# Patient Record
Sex: Female | Born: 1945 | Race: White | Hispanic: No | Smoking: Current every day smoker
Health system: Southern US, Community
[De-identification: ages and names within clinical notes are randomized; demographics above are authoritative.]

## PROBLEM LIST (undated history)

## (undated) DIAGNOSIS — K5732 Diverticulitis of large intestine without perforation or abscess without bleeding: Secondary | ICD-10-CM

## (undated) DIAGNOSIS — M1990 Unspecified osteoarthritis, unspecified site: Secondary | ICD-10-CM

## (undated) DIAGNOSIS — F32A Depression, unspecified: Secondary | ICD-10-CM

## (undated) DIAGNOSIS — I10 Essential (primary) hypertension: Secondary | ICD-10-CM

## (undated) DIAGNOSIS — F329 Major depressive disorder, single episode, unspecified: Secondary | ICD-10-CM

## (undated) DIAGNOSIS — K219 Gastro-esophageal reflux disease without esophagitis: Secondary | ICD-10-CM

## (undated) HISTORY — PX: GANGLION CYST EXCISION: SHX1691

## (undated) HISTORY — PX: APPENDECTOMY: SHX54

---

## 1973-06-03 HISTORY — PX: TONSILLECTOMY: SUR1361

## 1977-06-03 HISTORY — PX: ABDOMINAL HYSTERECTOMY: SHX81

## 1998-06-03 HISTORY — PX: AUGMENTATION MAMMAPLASTY: SUR837

## 1999-06-04 HISTORY — PX: BUNIONECTOMY: SHX129

## 2000-05-13 ENCOUNTER — Encounter: Admission: RE | Admit: 2000-05-13 | Discharge: 2000-05-13 | Payer: Self-pay

## 2005-01-31 ENCOUNTER — Ambulatory Visit: Payer: Self-pay | Admitting: Physician Assistant

## 2005-03-19 ENCOUNTER — Encounter: Payer: Self-pay | Admitting: Physical Medicine and Rehabilitation

## 2005-04-03 ENCOUNTER — Encounter: Payer: Self-pay | Admitting: Physical Medicine and Rehabilitation

## 2005-05-17 ENCOUNTER — Emergency Department: Payer: Self-pay | Admitting: Emergency Medicine

## 2006-03-05 ENCOUNTER — Encounter: Payer: Self-pay | Admitting: Orthopedic Surgery

## 2006-04-03 ENCOUNTER — Ambulatory Visit: Payer: Self-pay | Admitting: Physician Assistant

## 2006-04-03 ENCOUNTER — Encounter: Payer: Self-pay | Admitting: Orthopedic Surgery

## 2006-05-03 ENCOUNTER — Encounter: Payer: Self-pay | Admitting: Orthopedic Surgery

## 2006-06-03 ENCOUNTER — Encounter: Payer: Self-pay | Admitting: Orthopedic Surgery

## 2006-07-04 ENCOUNTER — Encounter: Payer: Self-pay | Admitting: Orthopedic Surgery

## 2006-07-11 ENCOUNTER — Encounter: Admission: RE | Admit: 2006-07-11 | Discharge: 2006-07-11 | Payer: Self-pay | Admitting: Orthopedic Surgery

## 2006-09-04 ENCOUNTER — Ambulatory Visit: Payer: Self-pay

## 2007-06-26 ENCOUNTER — Ambulatory Visit: Payer: Self-pay | Admitting: Physician Assistant

## 2008-01-28 ENCOUNTER — Ambulatory Visit: Payer: Self-pay | Admitting: Unknown Physician Specialty

## 2008-12-13 ENCOUNTER — Ambulatory Visit: Payer: Self-pay | Admitting: Physician Assistant

## 2009-12-26 ENCOUNTER — Ambulatory Visit: Payer: Self-pay | Admitting: Physician Assistant

## 2010-06-03 HISTORY — PX: SHOULDER ARTHROSCOPY: SHX128

## 2011-01-18 ENCOUNTER — Ambulatory Visit: Payer: Self-pay | Admitting: Unknown Physician Specialty

## 2011-02-13 ENCOUNTER — Ambulatory Visit: Payer: Self-pay | Admitting: Unknown Physician Specialty

## 2011-12-31 ENCOUNTER — Ambulatory Visit: Payer: Self-pay | Admitting: Physician Assistant

## 2012-02-21 ENCOUNTER — Encounter (HOSPITAL_COMMUNITY): Payer: Self-pay | Admitting: Pharmacy Technician

## 2012-02-26 ENCOUNTER — Encounter (HOSPITAL_COMMUNITY): Payer: Self-pay

## 2012-02-26 ENCOUNTER — Encounter (HOSPITAL_COMMUNITY)
Admission: RE | Admit: 2012-02-26 | Discharge: 2012-02-26 | Disposition: A | Discharge status: Home / Self Care | Payer: Medicare Other | Source: Ambulatory Visit | Attending: Orthopedic Surgery | Admitting: Orthopedic Surgery

## 2012-02-26 ENCOUNTER — Ambulatory Visit (HOSPITAL_COMMUNITY)
Admission: RE | Admit: 2012-02-26 | Discharge: 2012-02-26 | Disposition: A | Discharge status: Home / Self Care | Payer: Medicare Other | Source: Ambulatory Visit | Attending: Orthopedic Surgery | Admitting: Orthopedic Surgery

## 2012-02-26 DIAGNOSIS — Z01818 Encounter for other preprocedural examination: Secondary | ICD-10-CM | POA: Insufficient documentation

## 2012-02-26 DIAGNOSIS — Z01812 Encounter for preprocedural laboratory examination: Secondary | ICD-10-CM | POA: Insufficient documentation

## 2012-02-26 DIAGNOSIS — R911 Solitary pulmonary nodule: Secondary | ICD-10-CM | POA: Insufficient documentation

## 2012-02-26 HISTORY — DX: Depression, unspecified: F32.A

## 2012-02-26 HISTORY — DX: Essential (primary) hypertension: I10

## 2012-02-26 HISTORY — DX: Gastro-esophageal reflux disease without esophagitis: K21.9

## 2012-02-26 HISTORY — DX: Unspecified osteoarthritis, unspecified site: M19.90

## 2012-02-26 HISTORY — DX: Major depressive disorder, single episode, unspecified: F32.9

## 2012-02-26 LAB — CBC
MCV: 90.5 fL (ref 78.0–100.0)
Platelets: 259 10*3/uL (ref 150–400)
RDW: 12.7 % (ref 11.5–15.5)
WBC: 10.9 10*3/uL — ABNORMAL HIGH (ref 4.0–10.5)

## 2012-02-26 LAB — BASIC METABOLIC PANEL
CO2: 28 mEq/L (ref 19–32)
Calcium: 10.1 mg/dL (ref 8.4–10.5)
Creatinine, Ser: 0.81 mg/dL (ref 0.50–1.10)
GFR calc Af Amer: 85 mL/min — ABNORMAL LOW (ref >90)
GFR calc non Af Amer: 73 mL/min — ABNORMAL LOW (ref >90)

## 2012-02-26 LAB — SURGICAL PCR SCREEN
MRSA, PCR: NEGATIVE
Staphylococcus aureus: NEGATIVE

## 2012-02-26 MED ORDER — CHLORHEXIDINE GLUCONATE 4 % EX LIQD: 60.0000 mL | Freq: Once | CUTANEOUS | Status: DC

## 2012-02-26 NOTE — Pre-Procedure Instructions (Addendum)
20 Audrey Acosta  02/26/2012   Your procedure is scheduled on:  03/05/12  Report to [facility] at530 AM.  Call this number if you have problems the morning of surgery: [phone]   Remember:   Do not eat food or drink:After Midnight.    Take these medicines the morning of surgery with A SIP OF WATER: pain med , celexa, bupropion, metoprolol, omeprazole   Do not wear jewelry, make-up or nail polish.  Do not wear lotions, powders, or perfumes. .  Do not shave 48 hours prior to surgery. Men may shave face and neck.  Do not bring valuables to the hospital.  Contacts, dentures or bridgework may not be worn into surgery.  Leave suitcase in the car. After surgery it may be brought to your room.  For patients admitted to the hospital, checkout time is 11:00 AM the day of discharge.   Patients discharged the day of surgery will not be allowed to drive home.  Name and phone number of your driverdtr Audrey Acosta [phone]  Special Instructions: Incentive Spirometry - Practice and bring it with you on the day of surgery. Shower using CHG 2 nights before surgery and the night before surgery.  If you shower the day of surgery use CHG.  Use special wash - you have one bottle of CHG for all showers.  You should use approximately 1/3 of the bottle for each shower.   Please read over the following fact sheets that you were given: Pain Booklet, Coughing and Deep Breathing, Blood Transfusion Information, MRSA Information and Surgical Site Infection Prevention

## 2012-03-04 MED ORDER — CEFAZOLIN SODIUM-DEXTROSE 2-3 GM-% IV SOLR
2.0000 g | INTRAVENOUS | Status: AC
  Administered 2012-03-05: 2 g via INTRAVENOUS
  Filled 2012-03-04: qty 50

## 2012-03-04 MED ORDER — LACTATED RINGERS IV SOLN
INTRAVENOUS | Status: DC
  Administered 2012-03-05 (×2): via INTRAVENOUS

## 2012-03-05 ENCOUNTER — Encounter (HOSPITAL_COMMUNITY)
Admission: RE | Disposition: A | Discharge status: Home / Self Care | Payer: Self-pay | Source: Ambulatory Visit | Attending: Orthopedic Surgery

## 2012-03-05 ENCOUNTER — Ambulatory Visit (HOSPITAL_COMMUNITY): Payer: Medicare Other | Admitting: Anesthesiology

## 2012-03-05 ENCOUNTER — Inpatient Hospital Stay (HOSPITAL_COMMUNITY)
Admission: RE | Admit: 2012-03-05 | Discharge: 2012-03-06 | DRG: 484 | Disposition: A | Discharge status: Home / Self Care | Payer: Medicare Other | Source: Ambulatory Visit | Attending: Orthopedic Surgery | Admitting: Orthopedic Surgery

## 2012-03-05 ENCOUNTER — Encounter (HOSPITAL_COMMUNITY): Payer: Self-pay

## 2012-03-05 ENCOUNTER — Encounter (HOSPITAL_COMMUNITY): Payer: Self-pay | Admitting: Anesthesiology

## 2012-03-05 DIAGNOSIS — F17200 Nicotine dependence, unspecified, uncomplicated: Secondary | ICD-10-CM | POA: Diagnosis present

## 2012-03-05 DIAGNOSIS — F329 Major depressive disorder, single episode, unspecified: Secondary | ICD-10-CM | POA: Diagnosis present

## 2012-03-05 DIAGNOSIS — K219 Gastro-esophageal reflux disease without esophagitis: Secondary | ICD-10-CM | POA: Diagnosis present

## 2012-03-05 DIAGNOSIS — Z79899 Other long term (current) drug therapy: Secondary | ICD-10-CM

## 2012-03-05 DIAGNOSIS — I10 Essential (primary) hypertension: Secondary | ICD-10-CM | POA: Diagnosis present

## 2012-03-05 DIAGNOSIS — M19019 Primary osteoarthritis, unspecified shoulder: Principal | ICD-10-CM | POA: Diagnosis present

## 2012-03-05 DIAGNOSIS — F3289 Other specified depressive episodes: Secondary | ICD-10-CM | POA: Diagnosis present

## 2012-03-05 HISTORY — PX: REVERSE SHOULDER ARTHROPLASTY: SHX5054

## 2012-03-05 SURGERY — ARTHROPLASTY, SHOULDER, TOTAL, REVERSE
Anesthesia: General | Site: Shoulder | Laterality: Right | Wound class: Clean

## 2012-03-05 MED ORDER — FENTANYL CITRATE 0.05 MG/ML IJ SOLN
INTRAMUSCULAR | Status: DC | PRN
  Administered 2012-03-05: 250 ug via INTRAVENOUS

## 2012-03-05 MED ORDER — ROCURONIUM BROMIDE 100 MG/10ML IV SOLN
INTRAVENOUS | Status: DC | PRN
  Administered 2012-03-05: 40 mg via INTRAVENOUS

## 2012-03-05 MED ORDER — SIMVASTATIN 20 MG PO TABS
20.0000 mg | ORAL_TABLET | Freq: Every day | ORAL | Status: DC
  Administered 2012-03-05: 20 mg via ORAL
  Filled 2012-03-05 (×2): qty 1

## 2012-03-05 MED ORDER — PHENYLEPHRINE HCL 10 MG/ML IJ SOLN
INTRAMUSCULAR | Status: DC | PRN
  Administered 2012-03-05: 120 ug via INTRAVENOUS
  Administered 2012-03-05 (×5): 80 ug via INTRAVENOUS

## 2012-03-05 MED ORDER — PROPOFOL 10 MG/ML IV BOLUS
INTRAVENOUS | Status: DC | PRN
  Administered 2012-03-05: 140 mg via INTRAVENOUS

## 2012-03-05 MED ORDER — NEOSTIGMINE METHYLSULFATE 1 MG/ML IJ SOLN
INTRAMUSCULAR | Status: DC | PRN
  Administered 2012-03-05: 2 mg via INTRAVENOUS

## 2012-03-05 MED ORDER — ALUM & MAG HYDROXIDE-SIMETH 200-200-20 MG/5ML PO SUSP: 30.0000 mL | ORAL | Status: DC | PRN

## 2012-03-05 MED ORDER — TRAZODONE HCL 50 MG PO TABS
50.0000 mg | ORAL_TABLET | Freq: Every day | ORAL | Status: DC
  Administered 2012-03-05: 50 mg via ORAL
  Filled 2012-03-05 (×2): qty 1

## 2012-03-05 MED ORDER — HYDROMORPHONE HCL PF 1 MG/ML IJ SOLN: 0.2500 mg | INTRAMUSCULAR | Status: DC | PRN

## 2012-03-05 MED ORDER — ACETAMINOPHEN 325 MG PO TABS: 650.0000 mg | ORAL_TABLET | Freq: Four times a day (QID) | ORAL | Status: DC | PRN

## 2012-03-05 MED ORDER — ONDANSETRON HCL 4 MG/2ML IJ SOLN: 4.0000 mg | Freq: Four times a day (QID) | INTRAMUSCULAR | Status: DC | PRN

## 2012-03-05 MED ORDER — METOCLOPRAMIDE HCL 10 MG PO TABS: 5.0000 mg | ORAL_TABLET | Freq: Three times a day (TID) | ORAL | Status: DC | PRN

## 2012-03-05 MED ORDER — ACETAMINOPHEN 10 MG/ML IV SOLN: 1000.0000 mg | Freq: Once | INTRAVENOUS | Status: DC | PRN

## 2012-03-05 MED ORDER — DEXAMETHASONE SODIUM PHOSPHATE 4 MG/ML IJ SOLN
INTRAMUSCULAR | Status: DC | PRN
  Administered 2012-03-05: 4 mg via INTRAVENOUS

## 2012-03-05 MED ORDER — SODIUM CHLORIDE 0.9 % IR SOLN
Status: DC | PRN
  Administered 2012-03-05: 1000 mL

## 2012-03-05 MED ORDER — GLYCOPYRROLATE 0.2 MG/ML IJ SOLN
INTRAMUSCULAR | Status: DC | PRN
  Administered 2012-03-05: .4 mg via INTRAVENOUS

## 2012-03-05 MED ORDER — KETOROLAC TROMETHAMINE 15 MG/ML IJ SOLN
15.0000 mg | Freq: Four times a day (QID) | INTRAMUSCULAR | Status: DC
  Administered 2012-03-05 – 2012-03-06 (×4): 15 mg via INTRAVENOUS
  Filled 2012-03-05 (×8): qty 1

## 2012-03-05 MED ORDER — CITALOPRAM HYDROBROMIDE 20 MG PO TABS
20.0000 mg | ORAL_TABLET | Freq: Every day | ORAL | Status: DC
  Administered 2012-03-06: 20 mg via ORAL
  Filled 2012-03-05: qty 1

## 2012-03-05 MED ORDER — METOPROLOL TARTRATE 50 MG PO TABS
75.0000 mg | ORAL_TABLET | Freq: Two times a day (BID) | ORAL | Status: DC
  Administered 2012-03-05: 75 mg via ORAL
  Filled 2012-03-05 (×3): qty 1

## 2012-03-05 MED ORDER — HYDROCODONE-ACETAMINOPHEN 5-325 MG PO TABS: 0.5000 | ORAL_TABLET | Freq: Two times a day (BID) | ORAL | Status: DC | PRN

## 2012-03-05 MED ORDER — ACETAMINOPHEN 650 MG RE SUPP: 650.0000 mg | Freq: Four times a day (QID) | RECTAL | Status: DC | PRN

## 2012-03-05 MED ORDER — CEFAZOLIN SODIUM 1-5 GM-% IV SOLN
1.0000 g | Freq: Four times a day (QID) | INTRAVENOUS | Status: AC
  Administered 2012-03-05 – 2012-03-06 (×3): 1 g via INTRAVENOUS
  Filled 2012-03-05 (×4): qty 50

## 2012-03-05 MED ORDER — LISINOPRIL 20 MG PO TABS
20.0000 mg | ORAL_TABLET | Freq: Two times a day (BID) | ORAL | Status: DC
Start: 2012-03-05 — End: 2012-03-06
  Administered 2012-03-05 (×2): 20 mg via ORAL
  Filled 2012-03-05 (×4): qty 1

## 2012-03-05 MED ORDER — BUPROPION HCL ER (SMOKING DET) 150 MG PO TB12
150.0000 mg | ORAL_TABLET | Freq: Two times a day (BID) | ORAL | Status: DC
  Administered 2012-03-06: 150 mg via ORAL
  Filled 2012-03-05 (×3): qty 1

## 2012-03-05 MED ORDER — TEMAZEPAM 15 MG PO CAPS: 15.0000 mg | ORAL_CAPSULE | Freq: Every evening | ORAL | Status: DC | PRN

## 2012-03-05 MED ORDER — ONDANSETRON HCL 4 MG PO TABS: 4.0000 mg | ORAL_TABLET | Freq: Four times a day (QID) | ORAL | Status: DC | PRN

## 2012-03-05 MED ORDER — POLYETHYLENE GLYCOL 3350 17 G PO PACK: 17.0000 g | PACK | Freq: Every day | ORAL | Status: DC | PRN

## 2012-03-05 MED ORDER — EPHEDRINE SULFATE 50 MG/ML IJ SOLN
INTRAMUSCULAR | Status: DC | PRN
  Administered 2012-03-05 (×2): 10 mg via INTRAVENOUS
  Administered 2012-03-05 (×2): 15 mg via INTRAVENOUS

## 2012-03-05 MED ORDER — DOCUSATE SODIUM 100 MG PO CAPS
100.0000 mg | ORAL_CAPSULE | Freq: Two times a day (BID) | ORAL | Status: DC
  Administered 2012-03-05 – 2012-03-06 (×3): 100 mg via ORAL
  Filled 2012-03-05 (×3): qty 1

## 2012-03-05 MED ORDER — PANTOPRAZOLE SODIUM 40 MG PO TBEC
40.0000 mg | DELAYED_RELEASE_TABLET | Freq: Every day | ORAL | Status: DC
  Filled 2012-03-05: qty 1

## 2012-03-05 MED ORDER — MIDAZOLAM HCL 5 MG/5ML IJ SOLN
INTRAMUSCULAR | Status: DC | PRN
  Administered 2012-03-05: 2 mg via INTRAVENOUS

## 2012-03-05 MED ORDER — LIDOCAINE HCL (CARDIAC) 20 MG/ML IV SOLN
INTRAVENOUS | Status: DC | PRN
  Administered 2012-03-05: 40 mg via INTRAVENOUS

## 2012-03-05 MED ORDER — HYDROCODONE-ACETAMINOPHEN 5-325 MG PO TABS
1.0000 | ORAL_TABLET | ORAL | Status: DC | PRN
  Administered 2012-03-05: 1 via ORAL
  Administered 2012-03-06: 2 via ORAL
  Administered 2012-03-06: 1 via ORAL
  Filled 2012-03-05: qty 1
  Filled 2012-03-05: qty 2
  Filled 2012-03-05: qty 1

## 2012-03-05 MED ORDER — DIPHENHYDRAMINE HCL 12.5 MG/5ML PO ELIX: 12.5000 mg | ORAL_SOLUTION | ORAL | Status: DC | PRN

## 2012-03-05 MED ORDER — LIDOCAINE HCL 4 % MT SOLN
OROMUCOSAL | Status: DC | PRN
  Administered 2012-03-05: 4 mL via TOPICAL

## 2012-03-05 MED ORDER — HYDROMORPHONE HCL PF 1 MG/ML IJ SOLN
0.5000 mg | INTRAMUSCULAR | Status: DC | PRN
  Administered 2012-03-06: 1 mg via INTRAVENOUS
  Filled 2012-03-05: qty 1

## 2012-03-05 MED ORDER — PHENOL 1.4 % MT LIQD: 1.0000 | OROMUCOSAL | Status: DC | PRN

## 2012-03-05 MED ORDER — MENTHOL 3 MG MT LOZG
1.0000 | LOZENGE | OROMUCOSAL | Status: DC | PRN
  Administered 2012-03-05: 3 mg via ORAL
  Filled 2012-03-05: qty 9

## 2012-03-05 MED ORDER — ONDANSETRON HCL 4 MG/2ML IJ SOLN
INTRAMUSCULAR | Status: DC | PRN
  Administered 2012-03-05: 4 mg via INTRAVENOUS

## 2012-03-05 MED ORDER — METOCLOPRAMIDE HCL 5 MG/ML IJ SOLN: 5.0000 mg | Freq: Three times a day (TID) | INTRAMUSCULAR | Status: DC | PRN

## 2012-03-05 MED ORDER — ONDANSETRON HCL 4 MG/2ML IJ SOLN: 4.0000 mg | Freq: Once | INTRAMUSCULAR | Status: DC | PRN

## 2012-03-05 SURGICAL SUPPLY — 70 items
BLADE SAW SGTL 83.5X18.5 (BLADE) ×2 IMPLANT
BRUSH FEMORAL CANAL (MISCELLANEOUS) IMPLANT
CEMENT BONE DEPUY (Cement) ×4 IMPLANT
CLOTH BEACON ORANGE TIMEOUT ST (SAFETY) ×2 IMPLANT
CLSR STERI-STRIP ANTIMIC 1/2X4 (GAUZE/BANDAGES/DRESSINGS) ×1 IMPLANT
COVER SURGICAL LIGHT HANDLE (MISCELLANEOUS) ×2 IMPLANT
DRAPE INCISE IOBAN 66X45 STRL (DRAPES) ×2 IMPLANT
DRAPE SURG 17X11 SM STRL (DRAPES) ×2 IMPLANT
DRAPE U-SHAPE 47X51 STRL (DRAPES) ×2 IMPLANT
DRILL BIT 7/64X5 (BIT) ×1 IMPLANT
DRSG AQUACEL AG ADV 3.5X10 (GAUZE/BANDAGES/DRESSINGS) ×1 IMPLANT
DRSG MEPILEX BORDER 4X8 (GAUZE/BANDAGES/DRESSINGS) IMPLANT
DURAPREP 26ML APPLICATOR (WOUND CARE) ×2 IMPLANT
ELECT BLADE 4.0 EZ CLEAN MEGAD (MISCELLANEOUS)
ELECT CAUTERY BLADE 6.4 (BLADE) ×2 IMPLANT
ELECT REM PT RETURN 9FT ADLT (ELECTROSURGICAL) ×2
ELECTRODE BLDE 4.0 EZ CLN MEGD (MISCELLANEOUS) IMPLANT
ELECTRODE REM PT RTRN 9FT ADLT (ELECTROSURGICAL) ×1 IMPLANT
FACESHIELD LNG OPTICON STERILE (SAFETY) ×6 IMPLANT
GLOVE BIO SURGEON STRL SZ7.5 (GLOVE) ×2 IMPLANT
GLOVE BIO SURGEON STRL SZ8 (GLOVE) ×2 IMPLANT
GLOVE BIO SURGEON STRL SZ8.5 (GLOVE) ×2 IMPLANT
GLOVE BIOGEL PI IND STRL 8 (GLOVE) ×1 IMPLANT
GLOVE BIOGEL PI INDICATOR 8 (GLOVE) ×1
GLOVE ECLIPSE 8.5 STRL (GLOVE) ×1 IMPLANT
GLOVE EUDERMIC 7 POWDERFREE (GLOVE) ×2 IMPLANT
GLOVE SS BIOGEL STRL SZ 7.5 (GLOVE) ×1 IMPLANT
GLOVE SUPERSENSE BIOGEL SZ 7.5 (GLOVE) ×1
GLOVE SURG SS PI 8.0 STRL IVOR (GLOVE) ×1 IMPLANT
GOWN PREVENTION PLUS XXLARGE (GOWN DISPOSABLE) ×2 IMPLANT
GOWN STRL NON-REIN LRG LVL3 (GOWN DISPOSABLE) ×1 IMPLANT
GOWN STRL REIN XL XLG (GOWN DISPOSABLE) ×6 IMPLANT
HANDPIECE INTERPULSE COAX TIP (DISPOSABLE)
KIT BASIN OR (CUSTOM PROCEDURE TRAY) ×2 IMPLANT
KIT ROOM TURNOVER OR (KITS) ×2 IMPLANT
MANIFOLD NEPTUNE II (INSTRUMENTS) ×2 IMPLANT
NDL HYPO 25GX1X1/2 BEV (NEEDLE) IMPLANT
NDL SUT 6 .5 CRC .975X.05 MAYO (NEEDLE) IMPLANT
NEEDLE HYPO 25GX1X1/2 BEV (NEEDLE) IMPLANT
NEEDLE MAYO TAPER (NEEDLE) ×2
NS IRRIG 1000ML POUR BTL (IV SOLUTION) ×2 IMPLANT
PACK SHOULDER (CUSTOM PROCEDURE TRAY) ×2 IMPLANT
PAD ARMBOARD 7.5X6 YLW CONV (MISCELLANEOUS) ×4 IMPLANT
PASSER SUT SWANSON 36MM LOOP (INSTRUMENTS) IMPLANT
PRESSURIZER FEMORAL UNIV (MISCELLANEOUS) IMPLANT
RESTRICTOR CEMENT PE SZ 2 (Cement) ×2 IMPLANT
SET HNDPC FAN SPRY TIP SCT (DISPOSABLE) IMPLANT
SLING ARM IMMOBILIZER LRG (SOFTGOODS) IMPLANT
SLING ARM IMMOBILIZER MED (SOFTGOODS) ×2 IMPLANT
SPONGE LAP 18X18 X RAY DECT (DISPOSABLE) ×2 IMPLANT
SPONGE LAP 4X18 X RAY DECT (DISPOSABLE) ×2 IMPLANT
STRIP CLOSURE SKIN 1/2X4 (GAUZE/BANDAGES/DRESSINGS) ×1 IMPLANT
SUCTION FRAZIER TIP 10 FR DISP (SUCTIONS) ×2 IMPLANT
SUT BONE WAX W31G (SUTURE) IMPLANT
SUT FIBERWIRE #2 38 T-5 BLUE (SUTURE) ×6
SUT MNCRL AB 3-0 PS2 18 (SUTURE) ×2 IMPLANT
SUT VIC AB 1 CT1 27 (SUTURE) ×2
SUT VIC AB 1 CT1 27XBRD ANBCTR (SUTURE) ×3 IMPLANT
SUT VIC AB 2-0 CT1 27 (SUTURE) ×2
SUT VIC AB 2-0 CT1 TAPERPNT 27 (SUTURE) ×1 IMPLANT
SUT VIC AB 2-0 SH 27 (SUTURE)
SUT VIC AB 2-0 SH 27X BRD (SUTURE) IMPLANT
SUTURE FIBERWR #2 38 T-5 BLUE (SUTURE) ×3 IMPLANT
SYR 30ML SLIP (SYRINGE) IMPLANT
SYR CONTROL 10ML LL (SYRINGE) IMPLANT
TOWEL OR 17X24 6PK STRL BLUE (TOWEL DISPOSABLE) ×2 IMPLANT
TOWEL OR 17X26 10 PK STRL BLUE (TOWEL DISPOSABLE) ×2 IMPLANT
TOWER CARTRIDGE SMART MIX (DISPOSABLE) ×1 IMPLANT
TRAY FOLEY CATH 14FR (SET/KITS/TRAYS/PACK) IMPLANT
WATER STERILE IRR 1000ML POUR (IV SOLUTION) ×2 IMPLANT

## 2012-03-05 NOTE — Anesthesia Preprocedure Evaluation (Signed)
Anesthesia Evaluation  Patient identified by MRN, date of birth, ID band Patient awake    Reviewed: Allergy & Precautions, H&P , NPO status , Patient's Chart, lab work & pertinent test results  Airway Mallampati: II      Dental  (+) Teeth Intact   Pulmonary  breath sounds clear to auscultation        Cardiovascular Rhythm:Regular Rate:Normal     Neuro/Psych    GI/Hepatic   Endo/Other    Renal/GU      Musculoskeletal   Abdominal   Peds  Hematology   Anesthesia Other Findings   Reproductive/Obstetrics                           Anesthesia Physical Anesthesia Plan  ASA: II  Anesthesia Plan: General   Post-op Pain Management:    Induction: Intravenous  Airway Management Planned: Oral ETT  Additional Equipment:   Intra-op Plan:   Post-operative Plan: Extubation in OR  Informed Consent: I have reviewed the patients History and Physical, chart, labs and discussed the procedure including the risks, benefits and alternatives for the proposed anesthesia with the patient or authorized representative who has indicated his/her understanding and acceptance.   Dental advisory given  Plan Discussed with: CRNA and Surgeon  Anesthesia Plan Comments: (DJD R. Shoulder Htn GERD  Plan GA with interscalene  Kipp Brood, MD)        Anesthesia Quick Evaluation

## 2012-03-05 NOTE — Progress Notes (Signed)
UR COMPLETED  

## 2012-03-05 NOTE — Preoperative (Signed)
Beta Blockers   Reason not to administer Beta Blockers:Not Applicable 

## 2012-03-05 NOTE — Transfer of Care (Signed)
Immediate Anesthesia Transfer of Care Note  Patient: Audrey Acosta  Procedure(s) Performed: Procedure(s) (LRB) with comments: REVERSE SHOULDER ARTHROPLASTY (Right) - right reverse shoulder arthroplasty  Patient Location: PACU  Anesthesia Type: General  Level of Consciousness: awake, alert  and oriented  Airway & Oxygen Therapy: Patient Spontanous Breathing and Patient connected to nasal cannula oxygen  Post-op Assessment: Report given to PACU RN, Post -op Vital signs reviewed and stable and Patient moving all extremities X 4  Post vital signs: Reviewed and stable  Complications: No apparent anesthesia complications

## 2012-03-05 NOTE — Anesthesia Postprocedure Evaluation (Signed)
  Anesthesia Post-op Note  Patient: Audrey Acosta  Procedure(s) Performed: Procedure(s) (LRB) with comments: REVERSE SHOULDER ARTHROPLASTY (Right) - right reverse shoulder arthroplasty  Patient Location: PACU  Anesthesia Type: GA combined with regional for post-op pain  Level of Consciousness: awake, alert  and oriented  Airway and Oxygen Therapy: Patient Spontanous Breathing and Patient connected to nasal cannula oxygen  Post-op Pain: none  Post-op Assessment: Post-op Vital signs reviewed, Patient's Cardiovascular Status Stable, Respiratory Function Stable, Patent Airway, No signs of Nausea or vomiting and Adequate PO intake  Post-op Vital Signs: Reviewed and stable  Complications: No apparent anesthesia complications

## 2012-03-05 NOTE — Anesthesia Procedure Notes (Addendum)
Anesthesia Regional Block:  Interscalene brachial plexus block  Pre-Anesthetic Checklist: ,, timeout performed, Correct Patient, Correct Site, Correct Laterality, Correct Procedure, Correct Position, site marked, Risks and benefits discussed,  Surgical consent,  Pre-op evaluation,  At surgeon's request and post-op pain management  Laterality: Right  Prep: chloraprep       Needles:   Needle Type: Echogenic Stimulator Needle      Needle Gauge: 22 and 22 G    Additional Needles:  Procedures: ultrasound guided and nerve stimulator Interscalene brachial plexus block Narrative:  Start time: 03/05/2012 7:20 AM End time: 03/05/2012 7:30 AM  Performed by: Personally   Additional Notes: 25 cc 0.5% marcaine with 1:200 Epi injected easily      Procedure Name: Intubation Date/Time: 03/05/2012 7:53 AM Performed by: Marena Chancy Pre-anesthesia Checklist: Emergency Drugs available, Patient identified, Timeout performed, Suction available and Patient being monitored Patient Re-evaluated:Patient Re-evaluated prior to inductionOxygen Delivery Method: Circle system utilized Preoxygenation: Pre-oxygenation with 100% oxygen Intubation Type: IV induction Ventilation: Mask ventilation without difficulty Grade View: Grade I Tube type: Oral Tube size: 7.0 mm Number of attempts: 1 Placement Confirmation: ETT inserted through vocal cords under direct vision,  breath sounds checked- equal and bilateral and positive ETCO2 Secured at: 21 cm Tube secured with: Tape Dental Injury: Teeth and Oropharynx as per pre-operative assessment

## 2012-03-05 NOTE — Op Note (Signed)
03/05/2012  9:55 AM  PATIENT:   Audrey Acosta  66 y.o. female  PRE-OPERATIVE DIAGNOSIS:  right shoulder rct arthropathy   POST-OPERATIVE DIAGNOSIS:  same  PROCEDURE:  Right reverse shoulder arthroplasaty  SURGEON:  Rennis Chris Vania Rea. M.D.  ASSISTANTS: Shuford pac   ANESTHESIA:   GET + ISB  EBL: 250  SPECIMEN:  none  Drains: none   PATIENT DISPOSITION:  PACU - hemodynamically stable.    PLAN OF CARE: Admit to inpatient   Dictation# [id number]

## 2012-03-05 NOTE — Progress Notes (Signed)
Pt. Reports that she had Stress test, several yrs ago, states she "aced" it. Pt. Denies history of heart problems. EKG done this a.m. - on chart

## 2012-03-05 NOTE — H&P (Signed)
Audrey Acosta    Chief Complaint: right shoulder rct arthropathy  HPI: The patient is a 66 y.o. 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 Hypertension   . Depression   . GERD (gastroesophageal reflux disease)   . Arthritis     Past Surgical History  Procedure Date  . Shoulder arthroscopy 12    rt, lft rotator 07,08  . Bunionectomy 01    rt  . Ganglion cyst excision 90's    rt wrist  . Tonsillectomy 75  . Appendectomy   . Abdominal hysterectomy 79    No family history on file.  Social History:  reports that she has been smoking Cigarettes.  She has been smoking about .25 packs per day for the past 0 years. She does not have any smokeless tobacco history on file. She reports that she does not drink alcohol or use illicit drugs.  Allergies:  Allergies  Allergen Reactions  . Macrodantin (Nitrofurantoin Macrocrystal) Shortness Of Breath  . Percocet (Oxycodone-Acetaminophen)     "makes me looney"    Medications Prior to Admission  Medication Sig Dispense Refill  . buPROPion (ZYBAN) 150 MG 12 hr tablet Take 150 mg by mouth 2 (two) times daily.      . citalopram (CELEXA) 20 MG tablet Take 20 mg by mouth daily.      . hydrochlorothiazide (HYDRODIURIL) 25 MG tablet Take 25 mg by mouth daily as needed.      Marland Kitchen HYDROcodone-acetaminophen (NORCO/VICODIN) 5-325 MG per tablet Take 0.5-1 tablets by mouth 2 (two) times daily.      Marland Kitchen ibuprofen (ADVIL,MOTRIN) 200 MG tablet Take 200 mg by mouth every 6 (six) hours as needed.      Marland Kitchen lisinopril (PRINIVIL,ZESTRIL) 20 MG tablet Take 20 mg by mouth 2 (two) times daily.      . metoprolol (LOPRESSOR) 50 MG tablet Take 75 mg by mouth 2 (two) times daily.      Marland Kitchen omeprazole (PRILOSEC) 20 MG capsule Take 20 mg by mouth daily.      . pravastatin (PRAVACHOL) 40 MG tablet Take 40 mg by mouth daily.      . traZODone (DESYREL) 50 MG tablet Take 50 mg by mouth at bedtime.         Physical  Exam: right shoulder with painful and restricted ROM as noted at 02/19/12 office visit.  Vitals  Temp:  [98.2 F (36.8 C)] 98.2 F (36.8 C) (10/03 0610) Pulse Rate:  [61] 61  (10/03 0610) Resp:  [18] 18  (10/03 0610) BP: (116)/(72) 116/72 mmHg (10/03 0610) SpO2:  [96 %] 96 % (10/03 0610)  Assessment/Plan  Impression: right shoulder rct arthropathy   Plan of Action: Procedure(s): REVERSE SHOULDER ARTHROPLASTY  Justus Duerr M 03/05/2012, 7:28 AM

## 2012-03-06 MED ORDER — HYDROCODONE-ACETAMINOPHEN 5-325 MG PO TABS: 1.0000 | ORAL_TABLET | ORAL | Status: DC | PRN

## 2012-03-06 NOTE — Progress Notes (Signed)
Patient discharged in stable condition. Discharge instructions and prescriptions were explained. 

## 2012-03-06 NOTE — Progress Notes (Signed)
Occupational Therapy Discharge Patient Details Name: Audrey Acosta MRN: [id number] DOB: March 02, 1946 Today's Date: 03/06/2012 Time: 7829-5621 OT Time Calculation (min): 29 min  Patient discharged from OT services secondary to Pt. independent with shoulder exercises and able to demonstrate UB and LB dressing with supervision for safety. Pt. able to don and doff sling independently as well. Pt. should be safe for d/c home at this level.  Please see latest therapy progress note for current level of functioning and progress toward goals.    Progress and discharge plan discussed with patient and/or caregiver: Patient/Caregiver agrees with plan  GO     [name] 03/06/2012, 9:10 AM

## 2012-03-06 NOTE — Progress Notes (Signed)
I agree with the following treatment note after reviewing documentation.   Johnston, Aerielle Stoklosa Brynn   OTR/L Pager: [phone] Office: [phone] .   

## 2012-03-06 NOTE — Progress Notes (Signed)
Occupational Therapy Evaluation Patient Details Name: Audrey Acosta MRN: [id number] DOB: [birth year] Today's Date: 03/06/2012 Time: 4098-1191 OT Time Calculation (min): 29 min  OT Assessment / Plan / Recommendation Clinical Impression  Pt. 66 yo female s/p right reverse shoulder arthoplasty. Pt. very independent and has prior knowledge of shoulder protocol and exercises. Pt. demonstrates exercises independently and able to dress UB and LB with supervision for safety. No further acute OT needs at this time.     OT Assessment  Progress rehab of shoulder as ordered by MD at follow-up appointment    Follow Up Recommendations  Supervision - Intermittent    Barriers to Discharge      Equipment Recommendations  None recommended by OT    Recommendations for Other Services    Frequency       Precautions / Restrictions Precautions Required Braces or Orthoses: Other Brace/Splint   Pertinent Vitals/Pain Some pain during exercises    ADL  Grooming: Wash/dry hands;Teeth care;Supervision/safety Where Assessed - Grooming: Unsupported standing Upper Body Bathing: Performed;Maximal assistance (For RUE) Where Assessed - Upper Body Bathing: Unsupported sitting Lower Body Bathing: Simulated;Supervision/safety Where Assessed - Lower Body Bathing: Unsupported sit to stand Upper Body Dressing: Performed;Supervision/safety Where Assessed - Upper Body Dressing: Unsupported sitting Lower Body Dressing: Performed;Supervision/safety Where Assessed - Lower Body Dressing: Unsupported sit to stand Toilet Transfer: Simulated;Supervision/safety Toilet Transfer Method: Sit to Barista: Regular height toilet Transfers/Ambulation Related to ADLs: Pt. supervision for all transfers and ambulation.  ADL Comments: Pt. educated on sling wear and able to don and doff sling independently. Pt. completed UB and LB dressing with supervision for safety. Educated on AAROM FF to 90 degrees, ER to 30  degrees, abduction to 60 degrees, and pendulums. Pt. indpendent in completing exercises. Has prior knowledge from previous surgeries on sling wear and exercises.     OT Diagnosis:    OT Problem List:   OT Treatment Interventions:     OT Goals    Visit Information  Last OT Received On: 03/06/12 Assistance Needed: +1    Subjective Data  Subjective: This is aint my 1st rodeo, I have had this shoulder worked on 4 times Patient Stated Goal: To go back home   Prior Functioning     Home Living Lives With: Alone Available Help at Discharge: Family;Friend(s) Type of Home: House Home Access: Stairs to enter Secretary/administrator of Steps: 1-2 Home Layout: One level Bathroom Shower/Tub: Engineer, manufacturing systems: Handicapped height Home Adaptive Equipment: None Prior Function Level of Independence: Independent Able to Take Stairs?: Yes Driving: Yes Vocation: Agricultural consultant work Musician: No difficulties Dominant Hand: Right         Vision/Perception     Cognition  Overall Cognitive Status: Appears within functional limits for tasks assessed/performed Arousal/Alertness: Awake/alert Orientation Level: Appears intact for tasks assessed Behavior During Session: [facility] for tasks performed    Extremity/Trunk Assessment Left Upper Extremity Assessment LUE ROM/Strength/Tone: [facility] for tasks assessed     Mobility Bed Mobility Bed Mobility: Supine to Sit;Sitting - Scoot to Edge of Bed Supine to Sit: 5: Supervision Sitting - Scoot to Edge of Bed: 5: Supervision Details for Bed Mobility Assistance: Pt. supervision for bed mobility. Required verbal cue to get out on left side so she doesn't roll onto her RUE.  Transfers Transfers: Sit to Stand;Stand to Sit Sit to Stand: 5: Supervision;From bed;With upper extremity assist Stand to Sit: 5: Supervision;To bed;With upper extremity assist     Shoulder Instructions Donning/doffing shirt  without moving shoulder:  Independent Method for sponge bathing under operated UE: Maximal assistance;Patient able to independently direct caregiver Donning/doffing sling/immobilizer: Independent Correct positioning of sling/immobilizer: Independent Pendulum exercises (written home exercise program): Independent   Exercise Shoulder Exercises Pendulum Exercise: AAROM;Right;Standing Shoulder Flexion: AAROM;10 reps;Right;Supine Shoulder ABduction: AAROM;10 reps;Supine;Right Shoulder External Rotation: AAROM;Right;10 reps;Supine Elbow Flexion: AROM;Seated;Right Elbow Extension: AROM;Right;Seated Wrist Flexion: AROM;Right;Seated Wrist Extension: AROM;Right;Seated Digit Composite Flexion: AROM;Right;Seated Composite Extension: AROM;Right;Seated Neck Flexion: AROM;Seated Neck Extension: AROM;Seated Neck Lateral Flexion - Right: AROM;Seated Neck Lateral Flexion - Left: AROM;Seated   Balance     End of Session OT - End of Session Activity Tolerance: Patient tolerated treatment well Patient left: in bed;with call bell/phone within reach Nurse Communication: Mobility status  GO     [name] 03/06/2012, 9:09 AM

## 2012-03-06 NOTE — Op Note (Signed)
NAMELECRETIA, BUCZEK NO.:  [id number]  MEDICAL RECORD NO.:  [id number]  LOCATION:  5N07C                        FACILITY:  [facility]  PHYSICIAN:  Vania Rea. Tenia Goh, M.D.  DATE OF BIRTH:  1946-06-02  DATE OF PROCEDURE:  03/05/2012 DATE OF DISCHARGE:                              OPERATIVE REPORT   PREOPERATIVE DIAGNOSIS:  End-stage right shoulder rotator cuff tear arthropathy.  POSTOPERATIVE DIAGNOSIS:  End-stage right shoulder rotator cuff tear arthropathy.  PROCEDURE:  A right shoulder reverse arthroplasty utilizing a size 12 cemented DePuy stem with a 38+ 6 poly, 38 eccentric glenosphere, and standard base plate.  SURGEON:  Vania Rea. Bralyn Folkert, MD  ASSISTANT:  Lucita Lora. Shuford, PA-C  ANESTHESIA:  General endotracheal as well as an interscalene block.  ESTIMATED BLOOD LOSS:  250 mL.  DRAINS:  None.  HISTORY:  Ms. Maeda is a 66 year old female who has had chronic and progressive increasing bilateral shoulder pain, right much more problematic than the left with known end-stage rotator cuff tear arthropathy.  Due to her ongoing pain and increasing functional limitations and failure to respond to prolonged attempts at conservative management, decreasing functional capabilities, she is brought to the operating room at this time for planned reverse shoulder arthroplasty on the right.  Preoperatively, I counseled Ms. Krabill on treatment options as well as risks versus benefits thereof.  Possible surgical complications were reviewed including potential for bleeding, infection, neurovascular injury, persistent pain, loss of motion, failure of implant, anesthetic complication, possible need for additional surgery.  She understands and accepts and agrees to planned procedure.  PROCEDURE IN DETAIL:  After undergoing routine preop evaluation, the patient received prophylactic antibiotics and a interscalene block was established in the holding area by the Anesthesia  Department.  Placed supine on the operating table, underwent smooth induction of a general endotracheal anesthesia.  Placed into beach-chair position and appropriately padded and protected.  Right shoulder girdle region was sterilely prepped and draped in standard fashion.  A time-out was called.  An anterior approach to the right shoulder was made through deltopectoral interval for incision approximately 15 cm in length.  Skin flaps were elevated.  Electrocautery was used for hemostasis.  The cephalic vein was identified, protected, and retracted lateral to deltoid.  Pectoralis retracted medially and the upper cm tenotomized for improved visualization.  Conjoined tendon was mobilized and retracted medially.  Dissection carried in the subacromial/subdeltoid bursal region and self-retaining retractor was placed.  The biceps tendon was identified, tenotomized for later tenodesis.  The rotator cuff was deficient over the entirety of the apex of the greater tuberosity.  The subscapularis was divided from the lesser tuberosity and tagged with #2 FiberWires and mobilized circumferentially for probable later repair. We then dissected the capsule tissues from the inferior aspect of the humeral head allowing delivery of the head through the wound.  Gained access into the humeral medullary canal and reamed up to size 12 and then with the intramedullary cutting guide, made a resection of the humeral head at 0 degrees retroversion.  Metal cap placed over the cut surface of the proximal humerus and used combination of retractors to expose the glenoid and  removed the remnant of the cuff superiorly and also performed a circumferential labral resection.  We had gained complete visualization of the entire circumference of the glenoid and also released the portion of the triceps insertion of the inferior glenoid to ensure circumferential visualization of the glenoid.  A central guidepin was then placed.   The glenoid was reamed to a subchondral bony surface, central guide hole placed, and then the glenoid base plate was impacted into position, and then using standard technique we placed inferior and superior locking screws and an anterior nonlocking screw and the posterior screw did not have purchase, so it was not utilized.  Good bony purchase was achieved and the superior and inferior screws were then locked with excellent fixation.  Over guide wire we then placed the 38 eccentric glenosphere and this was transfixed with excellent stability and fixation.  Once this was completed, we turned our attention to the humerus where we placed a size 12 stem and reamed with a size 2 metaphyseal reamer.  We then placed a size 12 trial and with this the +3 poly showed appropriate soft tissue balance.  The trials were then removed.  The canal was irrigated.  A distal cement plug was placed at appropriate level and then we mixed cement, dried the canal, and at the appropriate consistency, the cement was introduced in retrograde fashion.  Hand compressed and we placed the final size 12 stem at 0 degrees retroversion and removed all extra cement meticulously.  After the cement had hardened, performed once again trial reductions between the +3 and +6 and +6 showed the better soft tissue balance, so trials were removed.  The final +6 poly was impacted into position.  Final reduction was performed.  The shoulder showed excellent stability, good motion, and good soft tissue balance all of much to my satisfaction.  The wound was irrigated.  Hemostasis was obtained.  We did repair the subscapularis back to the proximal humeral metaphyseal region with #2 FiberWire through a bone tunnel.  The biceps tendon was then tenodesed at the subpectoral level.  The deltopectoral interval was then reapproximated with 0 Vicryl.  A 2-0 Vicryl was used for the subcu layer and intracuticular 3-0 Monocryl for the skin followed  by Steri- Strips.  Dry dressing was applied.  The right arm was placed in a sling. The patient was awakened, extubated, and taken to recovery room in stable condition.  Tracey Shuford, PA-C was used and assisted throughout this case, essential for help with positioning of the extremity, soft tissue retraction, manipulation of the extremity and implants, wound closure and intraoperative decision making.     Vania Rea. Canda Podgorski, M.D.     KMS/MEDQ  D:  03/05/2012  T:  03/06/2012  Job:  [id number]

## 2012-03-06 NOTE — Progress Notes (Signed)
I agree with the following treatment note after reviewing documentation.   Johnston, Zelene Barga Brynn   OTR/L Pager: [phone] Office: [phone] .   

## 2012-03-06 NOTE — Discharge Summary (Signed)
PATIENT ID:      Audrey Acosta  MRN:     [id number] DOB/AGE:    [birth year] / 68 y.o.     DISCHARGE SUMMARY  ADMISSION DATE:    03/05/2012 DISCHARGE DATE: 03/06/2012    ADMISSION DIAGNOSIS: right shoulder rct arthropathy  Past Medical History  Diagnosis Date  . Hypertension   . Depression   . GERD (gastroesophageal reflux disease)   . Arthritis     DISCHARGE DIAGNOSIS:   Active Problems:  * No active hospital problems. *    PROCEDURE: Procedure(s): REVERSE SHOULDER ARTHROPLASTY on 03/05/2012  CONSULTS:   none HISTORY:  See H&P in chart.  HOSPITAL COURSE:  Audrey Acosta is a 66 y.o. admitted on 03/05/2012 with a chief complaint of right shoulder pain, and found to have a diagnosis of right shoulder rct arthropathy .  They were brought to the operating room on 03/05/2012 and underwent Procedure(s): REVERSE SHOULDER ARTHROPLASTY.    They were given perioperative antibiotics: Anti-infectives     Start     Dose/Rate Route Frequency Ordered Stop   03/05/12 1300   ceFAZolin (ANCEF) IVPB 1 g/50 mL premix        1 g 100 mL/hr over 30 Minutes Intravenous Every 6 hours 03/05/12 1147 03/06/12 0222   03/04/12 1435   ceFAZolin (ANCEF) IVPB 2 g/50 mL premix        2 g 100 mL/hr over 30 Minutes Intravenous 60 min pre-op 03/04/12 1435 03/05/12 0755        .  Patient underwent the above named procedure and tolerated it well. The following day they were hemodynamically stable and pain was controlled on oral analgesics. They were neurovascularly intact to the operative extremity. OT was ordered and worked with patient per protocol. They were medically and orthopaedically stable for discharge on POD 1.     DIAGNOSTIC STUDIES:  RECENT RADIOGRAPHIC STUDIES :  Dg Chest 2 View  02/26/2012  *RADIOLOGY REPORT*  Clinical Data: Preadmission radiograph  CHEST - 2 VIEW  Comparison: None  Findings: Heart size is normal.  There is no pleural effusion or edema.  No airspace consolidation.  Lungs are  hyperinflated and there are coarsened interstitial markings compatible with COPD. Tiny nodular densities within the left lung are noted which may represent the sequela of prior granulomatous disease.  Biapical scarring identified.  There is a scoliosis deformity affecting the thoracic and lumbar spine.  IMPRESSION:  1.  No acute cardiopulmonary abnormalities.   Original Report Authenticated By: [name], M.D.     RECENT VITAL SIGNS:  Patient Vitals for the past 24 hrs:  BP Temp Temp src Pulse Resp SpO2 Height Weight  03/06/12 0555 98/56 mmHg 97.9 F (36.6 C) Oral 64  18  97 % - -  03/05/12 2138 128/77 mmHg 97.9 F (36.6 C) Oral 70  18  98 % - -  03/05/12 1308 130/70 mmHg - - - - - - -  03/05/12 1110 125/70 mmHg 98.3 F (36.8 C) Oral 66  18  100 % 5' 1.5" (1.562 m) 54.432 kg (120 lb)  03/05/12 1100 139/66 mmHg 97 F (36.1 C) - 63  20  96 % - -  03/05/12 1045 135/66 mmHg - - 61  17  94 % - -  03/05/12 1030 137/65 mmHg - - 59  19  99 % - -  03/05/12 1015 135/62 mmHg 97.5 F (36.4 C) - 62  16  98 % - -  .  RECENT EKG RESULTS:    Orders placed during the hospital encounter of 03/05/12  . EKG 12-LEAD  . EKG 12-LEAD    DISCHARGE INSTRUCTIONS:    DISCHARGE MEDICATIONS:     Medication List     As of 03/06/2012  8:35 AM    TAKE these medications         buPROPion 150 MG 12 hr tablet   Commonly known as: ZYBAN   Take 150 mg by mouth 2 (two) times daily.      citalopram 20 MG tablet   Commonly known as: CELEXA   Take 20 mg by mouth daily.      hydrochlorothiazide 25 MG tablet   Commonly known as: HYDRODIURIL   Take 25 mg by mouth daily as needed.      HYDROcodone-acetaminophen 5-325 MG per tablet   Commonly known as: NORCO/VICODIN   Take 0.5-1 tablets by mouth 2 (two) times daily.      HYDROcodone-acetaminophen 5-325 MG per tablet   Commonly known as: NORCO/VICODIN   Take 1-2 tablets by mouth every 4 (four) hours as needed for pain.      ibuprofen 200 MG tablet    Commonly known as: ADVIL,MOTRIN   Take 200 mg by mouth every 6 (six) hours as needed.      lisinopril 20 MG tablet   Commonly known as: PRINIVIL,ZESTRIL   Take 20 mg by mouth 2 (two) times daily.      metoprolol 50 MG tablet   Commonly known as: LOPRESSOR   Take 75 mg by mouth 2 (two) times daily.      omeprazole 20 MG capsule   Commonly known as: PRILOSEC   Take 20 mg by mouth daily.      pravastatin 40 MG tablet   Commonly known as: PRAVACHOL   Take 40 mg by mouth daily.      traZODone 50 MG tablet   Commonly known as: DESYREL   Take 50 mg by mouth at bedtime.        FOLLOW UP VISIT:       Follow-up Information    Follow up with SUPPLE,KEVIN M, MD. (call to be seen in 10-14 days)    Contact information:   [facility] [address] [place] [address] [phone]          DISCHARGE TO:   DISCHARGE CONDITION:  {Good  Bob Eastwood for Dr. Francena Hanly 03/06/2012, 8:35 AM

## 2012-03-13 ENCOUNTER — Encounter (HOSPITAL_COMMUNITY): Payer: Self-pay | Admitting: Orthopedic Surgery

## 2013-06-30 ENCOUNTER — Ambulatory Visit: Payer: Self-pay | Admitting: Physician Assistant

## 2015-02-22 ENCOUNTER — Other Ambulatory Visit: Payer: Self-pay | Admitting: Physician Assistant

## 2015-02-22 DIAGNOSIS — Z1231 Encounter for screening mammogram for malignant neoplasm of breast: Secondary | ICD-10-CM

## 2015-03-14 ENCOUNTER — Other Ambulatory Visit: Payer: Self-pay | Admitting: Physician Assistant

## 2015-03-14 ENCOUNTER — Ambulatory Visit
Admission: RE | Admit: 2015-03-14 | Discharge: 2015-03-14 | Disposition: A | Discharge status: Home / Self Care | Payer: PPO | Source: Ambulatory Visit | Attending: Physician Assistant | Admitting: Physician Assistant

## 2015-03-14 DIAGNOSIS — Z1231 Encounter for screening mammogram for malignant neoplasm of breast: Secondary | ICD-10-CM | POA: Diagnosis present

## 2015-04-11 ENCOUNTER — Emergency Department: Payer: PPO

## 2015-04-11 ENCOUNTER — Emergency Department
Admission: EM | Admit: 2015-04-11 | Discharge: 2015-04-11 | Disposition: A | Discharge status: Home / Self Care | Payer: PPO | Attending: Emergency Medicine | Admitting: Emergency Medicine

## 2015-04-11 DIAGNOSIS — R1032 Left lower quadrant pain: Secondary | ICD-10-CM | POA: Diagnosis present

## 2015-04-11 DIAGNOSIS — Z720 Tobacco use: Secondary | ICD-10-CM | POA: Insufficient documentation

## 2015-04-11 DIAGNOSIS — I10 Essential (primary) hypertension: Secondary | ICD-10-CM | POA: Diagnosis not present

## 2015-04-11 DIAGNOSIS — K5732 Diverticulitis of large intestine without perforation or abscess without bleeding: Secondary | ICD-10-CM | POA: Diagnosis not present

## 2015-04-11 DIAGNOSIS — Z79899 Other long term (current) drug therapy: Secondary | ICD-10-CM | POA: Diagnosis not present

## 2015-04-11 LAB — COMPREHENSIVE METABOLIC PANEL
ALK PHOS: 76 U/L (ref 38–126)
ALT: 13 U/L — AB (ref 14–54)
ANION GAP: 8 (ref 5–15)
AST: 20 U/L (ref 15–41)
Albumin: 3.6 g/dL (ref 3.5–5.0)
BUN: 15 mg/dL (ref 6–20)
CHLORIDE: 102 mmol/L (ref 101–111)
CO2: 26 mmol/L (ref 22–32)
Calcium: 9.2 mg/dL (ref 8.9–10.3)
Creatinine, Ser: 0.78 mg/dL (ref 0.44–1.00)
Glucose, Bld: 111 mg/dL — ABNORMAL HIGH (ref 65–99)
POTASSIUM: 4 mmol/L (ref 3.5–5.1)
SODIUM: 136 mmol/L (ref 135–145)
Total Bilirubin: 0.6 mg/dL (ref 0.3–1.2)
Total Protein: 7.4 g/dL (ref 6.5–8.1)

## 2015-04-11 LAB — URINALYSIS COMPLETE WITH MICROSCOPIC (ARMC ONLY)
BACTERIA UA: NONE SEEN
Bilirubin Urine: NEGATIVE
Glucose, UA: NEGATIVE mg/dL
HGB URINE DIPSTICK: NEGATIVE
KETONES UR: NEGATIVE mg/dL
NITRITE: NEGATIVE
PH: 6 (ref 5.0–8.0)
PROTEIN: NEGATIVE mg/dL
SPECIFIC GRAVITY, URINE: 1.026 (ref 1.005–1.030)
Squamous Epithelial / LPF: NONE SEEN

## 2015-04-11 LAB — CBC WITH DIFFERENTIAL/PLATELET
BASOS ABS: 0 10*3/uL (ref 0–0.1)
Basophils Relative: 0 %
EOS ABS: 0.2 10*3/uL (ref 0–0.7)
Eosinophils Relative: 2 %
HCT: 36.9 % (ref 35.0–47.0)
HEMOGLOBIN: 12.2 g/dL (ref 12.0–16.0)
LYMPHS ABS: 1.5 10*3/uL (ref 1.0–3.6)
LYMPHS PCT: 13 %
MCH: 29.4 pg (ref 26.0–34.0)
MCHC: 33.1 g/dL (ref 32.0–36.0)
MCV: 88.8 fL (ref 80.0–100.0)
Monocytes Absolute: 1.2 10*3/uL — ABNORMAL HIGH (ref 0.2–0.9)
Monocytes Relative: 10 %
NEUTROS PCT: 75 %
Neutro Abs: 8.7 10*3/uL — ABNORMAL HIGH (ref 1.4–6.5)
Platelets: 239 10*3/uL (ref 150–440)
RBC: 4.16 MIL/uL (ref 3.80–5.20)
RDW: 13 % (ref 11.5–14.5)
WBC: 11.6 10*3/uL — AB (ref 3.6–11.0)

## 2015-04-11 MED ORDER — ONDANSETRON HCL 4 MG/2ML IJ SOLN
4.0000 mg | Freq: Once | INTRAMUSCULAR | Status: AC
  Administered 2015-04-11: 4 mg via INTRAVENOUS

## 2015-04-11 MED ORDER — IOHEXOL 240 MG/ML SOLN
25.0000 mL | Freq: Once | INTRAMUSCULAR | Status: AC | PRN
  Administered 2015-04-11: 25 mL via INTRAVENOUS

## 2015-04-11 MED ORDER — HYDROCODONE-ACETAMINOPHEN 5-325 MG PO TABS
1.0000 | ORAL_TABLET | Freq: Once | ORAL | Status: AC
  Administered 2015-04-11: 1 via ORAL
  Filled 2015-04-11: qty 1

## 2015-04-11 MED ORDER — MORPHINE SULFATE (PF) 4 MG/ML IV SOLN
4.0000 mg | Freq: Once | INTRAVENOUS | Status: AC
  Administered 2015-04-11: 4 mg via INTRAVENOUS

## 2015-04-11 MED ORDER — IOHEXOL 300 MG/ML  SOLN
100.0000 mL | Freq: Once | INTRAMUSCULAR | Status: AC | PRN
  Administered 2015-04-11: 100 mL via INTRAVENOUS

## 2015-04-11 MED ORDER — ONDANSETRON HCL 4 MG/2ML IJ SOLN
INTRAMUSCULAR | Status: AC
  Administered 2015-04-11: 4 mg via INTRAVENOUS
  Filled 2015-04-11: qty 2

## 2015-04-11 MED ORDER — MORPHINE SULFATE (PF) 4 MG/ML IV SOLN
INTRAVENOUS | Status: AC
  Administered 2015-04-11: 4 mg via INTRAVENOUS
  Filled 2015-04-11: qty 1

## 2015-04-11 MED ORDER — HYDROCODONE-ACETAMINOPHEN 5-325 MG PO TABS: 1.0000 | ORAL_TABLET | ORAL | Status: DC | PRN

## 2015-04-11 MED ORDER — SODIUM CHLORIDE 0.9 % IV BOLUS (SEPSIS)
1000.0000 mL | Freq: Once | INTRAVENOUS | Status: AC
  Administered 2015-04-11: 1000 mL via INTRAVENOUS

## 2015-04-11 NOTE — Discharge Instructions (Signed)
Diverticulitis  Diverticulitis is when small pockets that have formed in your colon (large intestine) become infected or swollen.  HOME CARE  · Follow your doctor's instructions.  · Follow a special diet if told by your doctor.  · When you feel better, your doctor may tell you to change your diet. You may be told to eat a lot of fiber. Fruits and vegetables are good sources of fiber. Fiber makes it easier to poop (have bowel movements).  · Take supplements or probiotics as told by your doctor.  · Only take medicines as told by your doctor.  · Keep all follow-up visits with your doctor.  GET HELP IF:  · Your pain does not get better.  · You have a hard time eating food.  · You are not pooping like normal.  GET HELP RIGHT AWAY IF:  · Your pain gets worse.  · Your problems do not get better.  · Your problems suddenly get worse.  · You have a fever.  · You keep throwing up (vomiting).  · You have bloody or black, tarry poop (stool).  MAKE SURE YOU:   · Understand these instructions.  · Will watch your condition.  · Will get help right away if you are not doing well or get worse.     This information is not intended to replace advice given to you by your health care provider. Make sure you discuss any questions you have with your health care provider.     Document Released: 11/06/2007 Document Revised: 05/25/2013 Document Reviewed: 04/14/2013  Elsevier Interactive Patient Education ©2016 [employer]

## 2015-04-11 NOTE — ED Notes (Signed)
Pt returned from CT °

## 2015-04-11 NOTE — ED Notes (Signed)
Took pt meal tray and Ginger Ale, ok per Dr. Mamie Nick.

## 2015-04-11 NOTE — ED Notes (Addendum)
Per patient she was diagnosed with diverticulitis yesterday and sent home with Cipro and flagyl.  Patient was told by Dr. Gabriel Carina that if symptoms got worse to come to ER. First dose yesterday at 5PM and second dose at 12pm today.

## 2015-04-11 NOTE — ED Provider Notes (Signed)
[facility] Emergency Department Provider Note  Time seen: 6:34 PM  I have reviewed the triage vital signs and the nursing notes.   HISTORY  Chief Complaint Abdominal Pain    HPI Audrey Acosta is a 69 y.o. female with a past medical history of hyper tension,depression, GERD, arthritis who presents the emergency department left lower quadrant pain. According to the patient for the past 4 days she has had left lower quadrant pain and noted occasional blood in her stool several days ago. She was seen by her primary care physician and was diagnosed clinically has diverticulitis and started on antibiotics. Patient states her pain acutely worsened last night and she was told to go to the emergency department if she has any worsening pain. States the pain is currently a 5/10, but last night was a 9/10. Also states intermittent fevers to 101 at home. Denies nausea or vomiting. Has had loose stool. Denies any black stool. Describes a dull aching pain to the left lower quadrant.    Past Medical History  Diagnosis Date  . Hypertension   . Depression   . GERD (gastroesophageal reflux disease)   . Arthritis     There are no active problems to display for this patient.   Past Surgical History  Procedure Laterality Date  . Shoulder arthroscopy  12    rt, lft rotator 07,08  . Bunionectomy  01    rt  . Ganglion cyst excision  90's    rt wrist  . Tonsillectomy  75  . Appendectomy    . Abdominal hysterectomy  79  . Reverse shoulder arthroplasty  03/05/2012  . Reverse shoulder arthroplasty  03/05/2012    Procedure: REVERSE SHOULDER ARTHROPLASTY;  Surgeon: Marin Shutter, MD;  Location: [place];  Service: Orthopedics;  Laterality: Right;  right reverse shoulder arthroplasty    Current Outpatient Rx  Name  Route  Sig  Dispense  Refill  . buPROPion (ZYBAN) 150 MG 12 hr tablet   Oral   Take 150 mg by mouth 2 (two) times daily.         . citalopram (CELEXA) 20 MG  tablet   Oral   Take 20 mg by mouth daily.         . hydrochlorothiazide (HYDRODIURIL) 25 MG tablet   Oral   Take 25 mg by mouth daily as needed.         Marland Kitchen HYDROcodone-acetaminophen (NORCO) 5-325 MG per tablet   Oral   Take 1-2 tablets by mouth every 4 (four) hours as needed for pain.   50 tablet   1   . HYDROcodone-acetaminophen (NORCO/VICODIN) 5-325 MG per tablet   Oral   Take 0.5-1 tablets by mouth 2 (two) times daily.         Marland Kitchen ibuprofen (ADVIL,MOTRIN) 200 MG tablet   Oral   Take 200 mg by mouth every 6 (six) hours as needed.         Marland Kitchen lisinopril (PRINIVIL,ZESTRIL) 20 MG tablet   Oral   Take 20 mg by mouth 2 (two) times daily.         . metoprolol (LOPRESSOR) 50 MG tablet   Oral   Take 75 mg by mouth 2 (two) times daily.         Marland Kitchen omeprazole (PRILOSEC) 20 MG capsule   Oral   Take 20 mg by mouth daily.         . pravastatin (PRAVACHOL) 40 MG tablet   Oral  Take 40 mg by mouth daily.         . traZODone (DESYREL) 50 MG tablet   Oral   Take 50 mg by mouth at bedtime.           Allergies Macrodantin and Percocet  History reviewed. No pertinent family history.  Social History Social History  Substance Use Topics  . Smoking status: Current Every Day Smoker -- 0.25 packs/day for 20 years    Types: Cigarettes  . Smokeless tobacco: Never Used  . Alcohol Use: No    Review of Systems Constitutional: As it for intermittent fevers at home. Cardiovascular: Negative for chest pain. Respiratory: Negative for shortness of breath. Gastrointestinal: As it for left lower quadrant abdominal pain, loose stool. Denies nausea or vomiting. Genitourinary: Negative for dysuria. Neurological: Negative for headache 10-point ROS otherwise negative.  ____________________________________________   PHYSICAL EXAM:  VITAL SIGNS: ED Triage Vitals  Enc Vitals Group     BP 04/11/15 1408 136/70 mmHg     Pulse Rate 04/11/15 1408 69     Resp 04/11/15 1408 16      Temp 04/11/15 1408 98.2 F (36.8 C)     Temp Source 04/11/15 1408 Oral     SpO2 04/11/15 1408 95 %     Weight 04/11/15 1408 123 lb (55.792 kg)     Height 04/11/15 1408 5\' 2"  (1.575 m)     Head Cir --      Peak Flow --      Pain Score 04/11/15 1409 7     Pain Loc --      Pain Edu? --      Excl. in [place]? --    Constitutional: Alert and oriented. Well appearing and in no distress. Eyes: Normal exam ENT   Head: Normocephalic and atraumatic.   Mouth/Throat: Mucous membranes are moist. Cardiovascular: Normal rate, regular rhythm. No murmur Respiratory: Normal respiratory effort without tachypnea nor retractions. Breath sounds are clear and equal bilaterally. No wheezes/rales/rhonchi. Gastrointestinal: Soft, moderate left lower quadrant tenderness palpation. No rebound or guarding. No distention. Musculoskeletal: Nontender with normal range of motion in all extremities.  Neurologic:  Normal speech and language. No gross focal neurologic deficits  Skin:  Skin is warm, dry and intact.  Psychiatric: Mood and affect are normal. Speech and behavior are normal.   ____________________________________________    RADIOLOGY  CT most consistent with an unconjugated sigmoid diverticulitis.  ____________________________________________    INITIAL IMPRESSION / ASSESSMENT AND PLAN / ED COURSE  Pertinent labs & imaging results that were available during my care of the patient were reviewed by me and considered in my medical decision making (see chart for details).  Patient presents with worsening left lower quadrant abdominal pain, states the acute pain is gone, and she is not back to her baseline pain. Denies any nausea or vomiting. Has had intermittent fevers. Currently on antibiotics as of yesterday for presumed diverticulitis. Given her increased pain we'll proceed with a CT abdomen/pelvis to help further evaluate. Patient does have a mildly elevated white blood cell count, labs  otherwise largely within normal limits.  CT shows sigmoid diverticulitis. Patient is prescribed Cipro and Flagyl by her primary care physician. We will add Norco. Patient will follow up with her primary care doctor. ____________________________________________   FINAL CLINICAL IMPRESSION(S) / ED DIAGNOSES  Left lower quadrant abdominal pain Sigmoid diverticulitis  Harvest Dark, MD 04/11/15 2036

## 2015-06-08 ENCOUNTER — Encounter: Payer: Self-pay | Admitting: *Deleted

## 2015-06-09 ENCOUNTER — Encounter
Admission: RE | Disposition: A | Discharge status: Home / Self Care | Payer: Self-pay | Source: Ambulatory Visit | Attending: Unknown Physician Specialty

## 2015-06-09 ENCOUNTER — Ambulatory Visit: Payer: PPO | Admitting: Anesthesiology

## 2015-06-09 ENCOUNTER — Ambulatory Visit
Admission: RE | Admit: 2015-06-09 | Discharge: 2015-06-09 | Disposition: A | Discharge status: Home / Self Care | Payer: PPO | Source: Ambulatory Visit | Attending: Unknown Physician Specialty | Admitting: Unknown Physician Specialty

## 2015-06-09 DIAGNOSIS — K635 Polyp of colon: Secondary | ICD-10-CM | POA: Diagnosis not present

## 2015-06-09 DIAGNOSIS — K219 Gastro-esophageal reflux disease without esophagitis: Secondary | ICD-10-CM | POA: Insufficient documentation

## 2015-06-09 DIAGNOSIS — F17210 Nicotine dependence, cigarettes, uncomplicated: Secondary | ICD-10-CM | POA: Insufficient documentation

## 2015-06-09 DIAGNOSIS — Z9109 Other allergy status, other than to drugs and biological substances: Secondary | ICD-10-CM | POA: Insufficient documentation

## 2015-06-09 DIAGNOSIS — K640 First degree hemorrhoids: Secondary | ICD-10-CM | POA: Diagnosis not present

## 2015-06-09 DIAGNOSIS — Z79899 Other long term (current) drug therapy: Secondary | ICD-10-CM | POA: Insufficient documentation

## 2015-06-09 DIAGNOSIS — I10 Essential (primary) hypertension: Secondary | ICD-10-CM | POA: Insufficient documentation

## 2015-06-09 DIAGNOSIS — R1032 Left lower quadrant pain: Secondary | ICD-10-CM | POA: Insufficient documentation

## 2015-06-09 DIAGNOSIS — M1990 Unspecified osteoarthritis, unspecified site: Secondary | ICD-10-CM | POA: Diagnosis not present

## 2015-06-09 DIAGNOSIS — D125 Benign neoplasm of sigmoid colon: Secondary | ICD-10-CM | POA: Diagnosis not present

## 2015-06-09 DIAGNOSIS — K5730 Diverticulosis of large intestine without perforation or abscess without bleeding: Secondary | ICD-10-CM | POA: Insufficient documentation

## 2015-06-09 DIAGNOSIS — Z885 Allergy status to narcotic agent status: Secondary | ICD-10-CM | POA: Insufficient documentation

## 2015-06-09 DIAGNOSIS — K5732 Diverticulitis of large intestine without perforation or abscess without bleeding: Secondary | ICD-10-CM | POA: Diagnosis not present

## 2015-06-09 DIAGNOSIS — F329 Major depressive disorder, single episode, unspecified: Secondary | ICD-10-CM | POA: Diagnosis not present

## 2015-06-09 DIAGNOSIS — K5790 Diverticulosis of intestine, part unspecified, without perforation or abscess without bleeding: Secondary | ICD-10-CM | POA: Diagnosis not present

## 2015-06-09 HISTORY — PX: COLONOSCOPY WITH PROPOFOL: SHX5780

## 2015-06-09 HISTORY — DX: Diverticulitis of large intestine without perforation or abscess without bleeding: K57.32

## 2015-06-09 SURGERY — COLONOSCOPY WITH PROPOFOL
Anesthesia: General

## 2015-06-09 MED ORDER — SODIUM CHLORIDE 0.9 % IV SOLN: INTRAVENOUS | Status: DC

## 2015-06-09 MED ORDER — SODIUM CHLORIDE 0.9 % IV SOLN
INTRAVENOUS | Status: DC
  Administered 2015-06-09 (×2): via INTRAVENOUS

## 2015-06-09 MED ORDER — FENTANYL CITRATE (PF) 100 MCG/2ML IJ SOLN
INTRAMUSCULAR | Status: DC | PRN
Start: 2015-06-09 — End: 2015-06-09
  Administered 2015-06-09: 50 ug via INTRAVENOUS

## 2015-06-09 MED ORDER — PROPOFOL 500 MG/50ML IV EMUL
INTRAVENOUS | Status: DC | PRN
  Administered 2015-06-09: 120 ug/kg/min via INTRAVENOUS

## 2015-06-09 MED ORDER — PROPOFOL 10 MG/ML IV BOLUS
INTRAVENOUS | Status: DC | PRN
  Administered 2015-06-09: 60 mg via INTRAVENOUS

## 2015-06-09 MED ORDER — SODIUM CHLORIDE 0.9 % IV SOLN
INTRAVENOUS | Status: DC
Start: 2015-06-09 — End: 2015-06-09

## 2015-06-09 MED ORDER — MIDAZOLAM HCL 5 MG/5ML IJ SOLN
INTRAMUSCULAR | Status: DC | PRN
  Administered 2015-06-09: 1 mg via INTRAVENOUS

## 2015-06-09 NOTE — Transfer of Care (Signed)
Immediate Anesthesia Transfer of Care Note  Patient: Audrey Acosta  Procedure(s) Performed: Procedure(s): COLONOSCOPY WITH PROPOFOL (N/A)  Patient Location: PACU  Anesthesia Type:General  Level of Consciousness: awake  Airway & Oxygen Therapy: Patient Spontanous Breathing  Post-op Assessment: Report given to RN  Post vital signs: Reviewed and stable  Last Vitals:  Filed Vitals:   06/09/15 1016  BP: 137/77  Pulse: 57  Temp: 36.5 C  Resp: 17    Complications: No apparent anesthesia complications

## 2015-06-09 NOTE — H&P (Signed)
Primary Care Physician:  Leonides Sake, MD Primary Gastroenterologist:  Dr. Vira Agar  Pre-Procedure History & Physical: HPI:  Audrey Acosta is a 70 y.o. female is here for an colonoscopy.   Past Medical History  Diagnosis Date  . Hypertension   . Depression   . GERD (gastroesophageal reflux disease)   . Arthritis   . Sigmoid diverticulitis     Past Surgical History  Procedure Laterality Date  . Shoulder arthroscopy  12    rt, lft rotator 07,08  . Bunionectomy  01    rt  . Ganglion cyst excision  90's    rt wrist  . Tonsillectomy  75  . Appendectomy    . Abdominal hysterectomy  79  . Reverse shoulder arthroplasty  03/05/2012  . Reverse shoulder arthroplasty  03/05/2012    Procedure: REVERSE SHOULDER ARTHROPLASTY;  Surgeon: Marin Shutter, MD;  Location: [place];  Service: Orthopedics;  Laterality: Right;  right reverse shoulder arthroplasty    Prior to Admission medications   Medication Sig Start Date End Date Taking? Authorizing Provider  lisinopril (PRINIVIL,ZESTRIL) 20 MG tablet Take 20 mg by mouth 2 (two) times daily.   Yes Historical Provider, MD  metoprolol (LOPRESSOR) 50 MG tablet Take 75 mg by mouth 2 (two) times daily.   Yes Historical Provider, MD  buPROPion (ZYBAN) 150 MG 12 hr tablet Take 150 mg by mouth 2 (two) times daily.    Historical Provider, MD  citalopram (CELEXA) 20 MG tablet Take 20 mg by mouth daily.    Historical Provider, MD  hydrochlorothiazide (HYDRODIURIL) 25 MG tablet Take 25 mg by mouth daily as needed.    Historical Provider, MD  HYDROcodone-acetaminophen (NORCO/VICODIN) 5-325 MG tablet Take 1 tablet by mouth every 4 (four) hours as needed for moderate pain. 04/11/15   Harvest Dark, MD  ibuprofen (ADVIL,MOTRIN) 200 MG tablet Take 200 mg by mouth every 6 (six) hours as needed.    Historical Provider, MD  omeprazole (PRILOSEC) 20 MG capsule Take 20 mg by mouth daily.    Historical Provider, MD  pravastatin (PRAVACHOL) 40 MG tablet Take 40 mg  by mouth daily.    Historical Provider, MD  traZODone (DESYREL) 50 MG tablet Take 50 mg by mouth at bedtime.    Historical Provider, MD    Allergies as of 05/05/2015 - Review Complete 04/11/2015  Allergen Reaction Noted  . Macrodantin [nitrofurantoin macrocrystal] Shortness Of Breath 02/21/2012  . Percocet [oxycodone-acetaminophen]  02/21/2012    History reviewed. No pertinent family history.  Social History   Social History  . Marital Status: Widowed    Spouse Name: N/A  . Number of Children: N/A  . Years of Education: N/A   Occupational History  . Not on file.   Social History Main Topics  . Smoking status: Current Every Day Smoker -- 0.25 packs/day for 20 years    Types: Cigarettes  . Smokeless tobacco: Never Used  . Alcohol Use: No  . Drug Use: No  . Sexual Activity: Not on file   Other Topics Concern  . Not on file   Social History Narrative    Review of Systems: See HPI, otherwise negative ROS  Physical Exam: There were no vitals taken for this visit. General:   Alert,  pleasant and cooperative in NAD Head:  Normocephalic and atraumatic. Neck:  Supple; no masses or thyromegaly. Lungs:  Clear throughout to auscultation.    Heart:  Regular rate and rhythm. Abdomen:  Soft, nontender and nondistended. Normal  bowel sounds, without guarding, and without rebound.   Neurologic:  Alert and  oriented x4;  grossly normal neurologically.  Impression/Plan: Audrey Acosta is here for an colonoscopy to be performed for abnormal CT scan and previous diverticulitis  Risks, benefits, limitations, and alternatives regarding  colonoscopy have been reviewed with the patient.  Questions have been answered.  All parties agreeable.   Gaylyn Cheers, MD  06/09/2015, 10:18 AM

## 2015-06-09 NOTE — Anesthesia Preprocedure Evaluation (Addendum)
Anesthesia Evaluation  Patient identified by MRN, date of birth, ID band Patient awake    Reviewed: Allergy & Precautions, NPO status , Patient's Chart, lab work & pertinent test results  Airway Mallampati: II       Dental   Pulmonary Current Smoker,           Cardiovascular hypertension, Pt. on medications and Pt. on home beta blockers      Neuro/Psych Depression negative neurological ROS     GI/Hepatic Neg liver ROS, GERD  Medicated,  Endo/Other  negative endocrine ROS  Renal/GU negative Renal ROS     Musculoskeletal  (+) Arthritis , Osteoarthritis,    Abdominal   Peds  Hematology negative hematology ROS (+)   Anesthesia Other Findings   Reproductive/Obstetrics                            Anesthesia Physical Anesthesia Plan  ASA: III  Anesthesia Plan: General   Post-op Pain Management:    Induction: Intravenous  Airway Management Planned: Nasal Cannula  Additional Equipment:   Intra-op Plan:   Post-operative Plan:   Informed Consent: I have reviewed the patients History and Physical, chart, labs and discussed the procedure including the risks, benefits and alternatives for the proposed anesthesia with the patient or authorized representative who has indicated his/her understanding and acceptance.     Plan Discussed with:   Anesthesia Plan Comments:         Anesthesia Quick Evaluation

## 2015-06-09 NOTE — Anesthesia Postprocedure Evaluation (Signed)
Anesthesia Post Note  Patient: Audrey Acosta  Procedure(s) Performed: Procedure(s) (LRB): COLONOSCOPY WITH PROPOFOL (N/A)  Patient location during evaluation: PACU Anesthesia Type: General Level of consciousness: awake and alert Pain management: pain level controlled Vital Signs Assessment: post-procedure vital signs reviewed and stable Respiratory status: spontaneous breathing and respiratory function stable Cardiovascular status: stable Anesthetic complications: no    Last Vitals:  Filed Vitals:   06/09/15 1016  BP: 137/77  Pulse: 57  Temp: 36.5 C  Resp: 17    Last Pain: There were no vitals filed for this visit.               KEPHART,WILLIAM K

## 2015-06-09 NOTE — Op Note (Signed)
[facility] Gastroenterology Patient Name: Audrey Acosta Procedure Date: 06/09/2015 10:27 AM MRN: [id number] Account #: [id number] Date of Birth: Oct 07, 1945 Admit Type: Outpatient Age: 70 Room: [facility] ENDO ROOM 1 Gender: Female Note Status: Finalized Procedure:         Colonoscopy Indications:       Abdominal pain in the left lower quadrant, Abnormal CT of                     the GI tract Providers:         Manya Silvas, MD Referring MD:      Lorin Mercy. Hamrick, MD (Referring MD) Medicines:         Propofol per Anesthesia Complications:     No immediate complications. Procedure:         Pre-Anesthesia Assessment:                    - After reviewing the risks and benefits, the patient was                     deemed in satisfactory condition to undergo the procedure.                    After obtaining informed consent, the colonoscope was                     passed under direct vision. Throughout the procedure, the                     patient's blood pressure, pulse, and oxygen saturations                     were monitored continuously. The Colonoscope was                     introduced through the anus and advanced to the the cecum,                     identified by appendiceal orifice and ileocecal valve. The                     colonoscopy was performed without difficulty. The patient                     tolerated the procedure well. The quality of the bowel                     preparation was excellent. Findings:      A 8 mm polyp was found in the sigmoid colon. The polyp was sessile. The       polyp was removed with a hot snare. Resection and retrieval were       complete. To prevent bleeding after the polypectomy, one hemostatic clip       was successfully placed (MRI compatible). There was no bleeding during,       and at the end, of the procedure.      A diminutive polyp was found in the distal sigmoid colon. The polyp was       sessile. The polyp was  removed with a cold snare. , but the polyp tissue       was not retrieved.      Multiple small-mouthed diverticula were found in the sigmoid colon. No       signs of  current diverticulitis.      Internal hemorrhoids were found during endoscopy. The hemorrhoids were       medium-sized and Grade I (internal hemorrhoids that do not prolapse). Impression:        - One 8 mm polyp in the sigmoid colon. Resected and                     retrieved. MRI-compatible clip was placed.                    - One diminutive polyp in the distal sigmoid colon.                     Complete resection. Polyp tissue not retrieved.                    - Diverticulosis in the sigmoid colon.                    - Internal hemorrhoids. Recommendation:    - Await pathology results. Manya Silvas, MD 06/09/2015 11:06:27 AM This report has been signed electronically. Number of Addenda: 0 Note Initiated On: 06/09/2015 10:27 AM Scope Withdrawal Time: 0 hours 23 minutes 14 seconds  Total Procedure Duration: 0 hours 29 minutes 12 seconds       [facility]

## 2015-06-12 LAB — SURGICAL PATHOLOGY

## 2015-06-15 ENCOUNTER — Encounter: Payer: Self-pay | Admitting: Unknown Physician Specialty

## 2015-06-28 DIAGNOSIS — M169 Osteoarthritis of hip, unspecified: Secondary | ICD-10-CM | POA: Diagnosis not present

## 2015-06-28 DIAGNOSIS — I10 Essential (primary) hypertension: Secondary | ICD-10-CM | POA: Diagnosis not present

## 2015-06-28 DIAGNOSIS — E7800 Pure hypercholesterolemia, unspecified: Secondary | ICD-10-CM | POA: Diagnosis not present

## 2015-06-28 DIAGNOSIS — Z1389 Encounter for screening for other disorder: Secondary | ICD-10-CM | POA: Diagnosis not present

## 2015-06-28 DIAGNOSIS — Z6820 Body mass index (BMI) 20.0-20.9, adult: Secondary | ICD-10-CM | POA: Diagnosis not present

## 2015-06-28 DIAGNOSIS — F418 Other specified anxiety disorders: Secondary | ICD-10-CM | POA: Diagnosis not present

## 2015-12-26 DIAGNOSIS — M159 Polyosteoarthritis, unspecified: Secondary | ICD-10-CM | POA: Diagnosis not present

## 2015-12-26 DIAGNOSIS — F418 Other specified anxiety disorders: Secondary | ICD-10-CM | POA: Diagnosis not present

## 2015-12-26 DIAGNOSIS — R413 Other amnesia: Secondary | ICD-10-CM | POA: Diagnosis not present

## 2015-12-26 DIAGNOSIS — I10 Essential (primary) hypertension: Secondary | ICD-10-CM | POA: Diagnosis not present

## 2015-12-26 DIAGNOSIS — Z79899 Other long term (current) drug therapy: Secondary | ICD-10-CM | POA: Diagnosis not present

## 2015-12-26 DIAGNOSIS — K219 Gastro-esophageal reflux disease without esophagitis: Secondary | ICD-10-CM | POA: Diagnosis not present

## 2016-01-22 DIAGNOSIS — Z6822 Body mass index (BMI) 22.0-22.9, adult: Secondary | ICD-10-CM | POA: Diagnosis not present

## 2016-01-22 DIAGNOSIS — B029 Zoster without complications: Secondary | ICD-10-CM | POA: Diagnosis not present

## 2016-03-20 DIAGNOSIS — M5410 Radiculopathy, site unspecified: Secondary | ICD-10-CM | POA: Diagnosis not present

## 2016-03-20 DIAGNOSIS — M545 Low back pain: Secondary | ICD-10-CM | POA: Diagnosis not present

## 2016-03-20 DIAGNOSIS — M542 Cervicalgia: Secondary | ICD-10-CM | POA: Diagnosis not present

## 2016-03-20 DIAGNOSIS — M5136 Other intervertebral disc degeneration, lumbar region: Secondary | ICD-10-CM | POA: Diagnosis not present

## 2016-04-23 DIAGNOSIS — M5416 Radiculopathy, lumbar region: Secondary | ICD-10-CM | POA: Diagnosis not present

## 2016-07-01 DIAGNOSIS — E7800 Pure hypercholesterolemia, unspecified: Secondary | ICD-10-CM | POA: Diagnosis not present

## 2016-07-01 DIAGNOSIS — Z79899 Other long term (current) drug therapy: Secondary | ICD-10-CM | POA: Diagnosis not present

## 2016-07-01 DIAGNOSIS — F418 Other specified anxiety disorders: Secondary | ICD-10-CM | POA: Diagnosis not present

## 2016-07-01 DIAGNOSIS — M159 Polyosteoarthritis, unspecified: Secondary | ICD-10-CM | POA: Diagnosis not present

## 2016-07-01 DIAGNOSIS — K219 Gastro-esophageal reflux disease without esophagitis: Secondary | ICD-10-CM | POA: Diagnosis not present

## 2016-07-01 DIAGNOSIS — I10 Essential (primary) hypertension: Secondary | ICD-10-CM | POA: Diagnosis not present

## 2016-07-01 DIAGNOSIS — Z6822 Body mass index (BMI) 22.0-22.9, adult: Secondary | ICD-10-CM | POA: Diagnosis not present

## 2016-07-17 ENCOUNTER — Other Ambulatory Visit: Payer: Self-pay | Admitting: Physician Assistant

## 2016-07-17 DIAGNOSIS — Z1231 Encounter for screening mammogram for malignant neoplasm of breast: Secondary | ICD-10-CM

## 2016-08-19 ENCOUNTER — Ambulatory Visit
Admission: RE | Admit: 2016-08-19 | Discharge: 2016-08-19 | Disposition: A | Discharge status: Home / Self Care | Payer: PPO | Source: Ambulatory Visit | Attending: Physician Assistant | Admitting: Physician Assistant

## 2016-08-19 ENCOUNTER — Other Ambulatory Visit: Payer: Self-pay | Admitting: Physician Assistant

## 2016-08-19 DIAGNOSIS — Z1231 Encounter for screening mammogram for malignant neoplasm of breast: Secondary | ICD-10-CM

## 2016-08-20 DIAGNOSIS — L578 Other skin changes due to chronic exposure to nonionizing radiation: Secondary | ICD-10-CM | POA: Diagnosis not present

## 2016-08-20 DIAGNOSIS — L821 Other seborrheic keratosis: Secondary | ICD-10-CM | POA: Diagnosis not present

## 2016-08-20 DIAGNOSIS — L820 Inflamed seborrheic keratosis: Secondary | ICD-10-CM | POA: Diagnosis not present

## 2016-08-20 DIAGNOSIS — Z411 Encounter for cosmetic surgery: Secondary | ICD-10-CM | POA: Diagnosis not present

## 2016-08-20 DIAGNOSIS — L300 Nummular dermatitis: Secondary | ICD-10-CM | POA: Diagnosis not present

## 2016-12-30 DIAGNOSIS — Z1389 Encounter for screening for other disorder: Secondary | ICD-10-CM | POA: Diagnosis not present

## 2016-12-30 DIAGNOSIS — Z0000 Encounter for general adult medical examination without abnormal findings: Secondary | ICD-10-CM | POA: Diagnosis not present

## 2016-12-30 DIAGNOSIS — Z136 Encounter for screening for cardiovascular disorders: Secondary | ICD-10-CM | POA: Diagnosis not present

## 2016-12-30 DIAGNOSIS — Z9181 History of falling: Secondary | ICD-10-CM | POA: Diagnosis not present

## 2016-12-30 DIAGNOSIS — E785 Hyperlipidemia, unspecified: Secondary | ICD-10-CM | POA: Diagnosis not present

## 2017-01-07 DIAGNOSIS — I10 Essential (primary) hypertension: Secondary | ICD-10-CM | POA: Diagnosis not present

## 2017-01-07 DIAGNOSIS — M159 Polyosteoarthritis, unspecified: Secondary | ICD-10-CM | POA: Diagnosis not present

## 2017-01-07 DIAGNOSIS — Z79899 Other long term (current) drug therapy: Secondary | ICD-10-CM | POA: Diagnosis not present

## 2017-01-07 DIAGNOSIS — Z139 Encounter for screening, unspecified: Secondary | ICD-10-CM | POA: Diagnosis not present

## 2017-01-07 DIAGNOSIS — E7800 Pure hypercholesterolemia, unspecified: Secondary | ICD-10-CM | POA: Diagnosis not present

## 2017-01-07 DIAGNOSIS — Z6823 Body mass index (BMI) 23.0-23.9, adult: Secondary | ICD-10-CM | POA: Diagnosis not present

## 2017-01-07 DIAGNOSIS — F418 Other specified anxiety disorders: Secondary | ICD-10-CM | POA: Diagnosis not present

## 2017-01-07 DIAGNOSIS — K219 Gastro-esophageal reflux disease without esophagitis: Secondary | ICD-10-CM | POA: Diagnosis not present

## 2017-01-07 DIAGNOSIS — R350 Frequency of micturition: Secondary | ICD-10-CM | POA: Diagnosis not present

## 2017-01-08 DIAGNOSIS — Z6823 Body mass index (BMI) 23.0-23.9, adult: Secondary | ICD-10-CM | POA: Diagnosis not present

## 2017-01-08 DIAGNOSIS — Z79899 Other long term (current) drug therapy: Secondary | ICD-10-CM | POA: Diagnosis not present

## 2017-01-08 DIAGNOSIS — E7800 Pure hypercholesterolemia, unspecified: Secondary | ICD-10-CM | POA: Diagnosis not present

## 2017-01-08 DIAGNOSIS — I10 Essential (primary) hypertension: Secondary | ICD-10-CM | POA: Diagnosis not present

## 2017-01-08 DIAGNOSIS — Z139 Encounter for screening, unspecified: Secondary | ICD-10-CM | POA: Diagnosis not present

## 2017-01-08 DIAGNOSIS — K219 Gastro-esophageal reflux disease without esophagitis: Secondary | ICD-10-CM | POA: Diagnosis not present

## 2017-01-08 DIAGNOSIS — R350 Frequency of micturition: Secondary | ICD-10-CM | POA: Diagnosis not present

## 2017-01-08 DIAGNOSIS — F418 Other specified anxiety disorders: Secondary | ICD-10-CM | POA: Diagnosis not present

## 2017-01-08 DIAGNOSIS — M159 Polyosteoarthritis, unspecified: Secondary | ICD-10-CM | POA: Diagnosis not present

## 2017-03-13 DIAGNOSIS — M25562 Pain in left knee: Secondary | ICD-10-CM | POA: Diagnosis not present

## 2017-03-13 DIAGNOSIS — M1711 Unilateral primary osteoarthritis, right knee: Secondary | ICD-10-CM | POA: Diagnosis not present

## 2017-03-13 DIAGNOSIS — M1712 Unilateral primary osteoarthritis, left knee: Secondary | ICD-10-CM | POA: Diagnosis not present

## 2017-03-13 DIAGNOSIS — M25561 Pain in right knee: Secondary | ICD-10-CM | POA: Diagnosis not present

## 2017-07-10 DIAGNOSIS — Z79899 Other long term (current) drug therapy: Secondary | ICD-10-CM | POA: Diagnosis not present

## 2017-07-10 DIAGNOSIS — I10 Essential (primary) hypertension: Secondary | ICD-10-CM | POA: Diagnosis not present

## 2017-07-10 DIAGNOSIS — M159 Polyosteoarthritis, unspecified: Secondary | ICD-10-CM | POA: Diagnosis not present

## 2017-07-10 DIAGNOSIS — R69 Illness, unspecified: Secondary | ICD-10-CM | POA: Diagnosis not present

## 2017-07-10 DIAGNOSIS — E7800 Pure hypercholesterolemia, unspecified: Secondary | ICD-10-CM | POA: Diagnosis not present

## 2017-07-10 DIAGNOSIS — Z6824 Body mass index (BMI) 24.0-24.9, adult: Secondary | ICD-10-CM | POA: Diagnosis not present

## 2017-07-10 DIAGNOSIS — R32 Unspecified urinary incontinence: Secondary | ICD-10-CM | POA: Diagnosis not present

## 2017-07-28 DIAGNOSIS — R69 Illness, unspecified: Secondary | ICD-10-CM | POA: Diagnosis not present

## 2017-08-06 DIAGNOSIS — S6291XA Unspecified fracture of right wrist and hand, initial encounter for closed fracture: Secondary | ICD-10-CM | POA: Diagnosis not present

## 2017-08-07 DIAGNOSIS — M25532 Pain in left wrist: Secondary | ICD-10-CM | POA: Diagnosis not present

## 2017-08-07 DIAGNOSIS — S52502A Unspecified fracture of the lower end of left radius, initial encounter for closed fracture: Secondary | ICD-10-CM | POA: Diagnosis not present

## 2017-08-07 DIAGNOSIS — M13842 Other specified arthritis, left hand: Secondary | ICD-10-CM | POA: Diagnosis not present

## 2017-08-08 DIAGNOSIS — S52572A Other intraarticular fracture of lower end of left radius, initial encounter for closed fracture: Secondary | ICD-10-CM | POA: Diagnosis not present

## 2017-08-08 DIAGNOSIS — Y999 Unspecified external cause status: Secondary | ICD-10-CM | POA: Diagnosis not present

## 2017-08-08 DIAGNOSIS — G8918 Other acute postprocedural pain: Secondary | ICD-10-CM | POA: Diagnosis not present

## 2017-08-22 DIAGNOSIS — Z4889 Encounter for other specified surgical aftercare: Secondary | ICD-10-CM | POA: Diagnosis not present

## 2017-08-22 DIAGNOSIS — M25532 Pain in left wrist: Secondary | ICD-10-CM | POA: Diagnosis not present

## 2017-08-22 DIAGNOSIS — S52509A Unspecified fracture of the lower end of unspecified radius, initial encounter for closed fracture: Secondary | ICD-10-CM | POA: Insufficient documentation

## 2017-08-22 DIAGNOSIS — S52572D Other intraarticular fracture of lower end of left radius, subsequent encounter for closed fracture with routine healing: Secondary | ICD-10-CM | POA: Diagnosis not present

## 2017-09-05 DIAGNOSIS — Z4889 Encounter for other specified surgical aftercare: Secondary | ICD-10-CM | POA: Diagnosis not present

## 2017-09-05 DIAGNOSIS — M25632 Stiffness of left wrist, not elsewhere classified: Secondary | ICD-10-CM | POA: Diagnosis not present

## 2017-09-05 DIAGNOSIS — M25532 Pain in left wrist: Secondary | ICD-10-CM | POA: Diagnosis not present

## 2017-09-05 DIAGNOSIS — S52532D Colles' fracture of left radius, subsequent encounter for closed fracture with routine healing: Secondary | ICD-10-CM | POA: Diagnosis not present

## 2017-09-10 DIAGNOSIS — R69 Illness, unspecified: Secondary | ICD-10-CM | POA: Diagnosis not present

## 2017-09-18 DIAGNOSIS — M25632 Stiffness of left wrist, not elsewhere classified: Secondary | ICD-10-CM | POA: Diagnosis not present

## 2017-09-26 DIAGNOSIS — M25532 Pain in left wrist: Secondary | ICD-10-CM | POA: Diagnosis not present

## 2017-10-03 DIAGNOSIS — M25632 Stiffness of left wrist, not elsewhere classified: Secondary | ICD-10-CM | POA: Diagnosis not present

## 2017-10-06 DIAGNOSIS — Z4789 Encounter for other orthopedic aftercare: Secondary | ICD-10-CM | POA: Diagnosis not present

## 2017-10-06 DIAGNOSIS — M25532 Pain in left wrist: Secondary | ICD-10-CM | POA: Diagnosis not present

## 2017-10-06 DIAGNOSIS — M13842 Other specified arthritis, left hand: Secondary | ICD-10-CM | POA: Diagnosis not present

## 2017-10-06 DIAGNOSIS — S52532D Colles' fracture of left radius, subsequent encounter for closed fracture with routine healing: Secondary | ICD-10-CM | POA: Diagnosis not present

## 2017-10-06 DIAGNOSIS — Z0100 Encounter for examination of eyes and vision without abnormal findings: Secondary | ICD-10-CM | POA: Diagnosis not present

## 2017-10-06 DIAGNOSIS — H25813 Combined forms of age-related cataract, bilateral: Secondary | ICD-10-CM | POA: Diagnosis not present

## 2017-10-10 DIAGNOSIS — R69 Illness, unspecified: Secondary | ICD-10-CM | POA: Diagnosis not present

## 2017-10-10 DIAGNOSIS — I10 Essential (primary) hypertension: Secondary | ICD-10-CM | POA: Diagnosis not present

## 2017-10-10 DIAGNOSIS — M159 Polyosteoarthritis, unspecified: Secondary | ICD-10-CM | POA: Diagnosis not present

## 2017-10-15 ENCOUNTER — Other Ambulatory Visit: Payer: Self-pay | Admitting: Physician Assistant

## 2017-10-15 DIAGNOSIS — Z1231 Encounter for screening mammogram for malignant neoplasm of breast: Secondary | ICD-10-CM

## 2017-10-16 DIAGNOSIS — F419 Anxiety disorder, unspecified: Secondary | ICD-10-CM | POA: Diagnosis not present

## 2017-10-16 DIAGNOSIS — I10 Essential (primary) hypertension: Secondary | ICD-10-CM | POA: Diagnosis not present

## 2017-10-16 DIAGNOSIS — E785 Hyperlipidemia, unspecified: Secondary | ICD-10-CM | POA: Diagnosis not present

## 2017-10-16 DIAGNOSIS — M1990 Unspecified osteoarthritis, unspecified site: Secondary | ICD-10-CM | POA: Diagnosis not present

## 2017-10-16 DIAGNOSIS — R69 Illness, unspecified: Secondary | ICD-10-CM | POA: Diagnosis not present

## 2017-10-16 DIAGNOSIS — G8929 Other chronic pain: Secondary | ICD-10-CM | POA: Diagnosis not present

## 2017-10-16 DIAGNOSIS — K219 Gastro-esophageal reflux disease without esophagitis: Secondary | ICD-10-CM | POA: Diagnosis not present

## 2017-10-16 DIAGNOSIS — Z720 Tobacco use: Secondary | ICD-10-CM | POA: Diagnosis not present

## 2017-10-16 DIAGNOSIS — R32 Unspecified urinary incontinence: Secondary | ICD-10-CM | POA: Diagnosis not present

## 2017-10-16 DIAGNOSIS — G4700 Insomnia, unspecified: Secondary | ICD-10-CM | POA: Diagnosis not present

## 2017-10-29 DIAGNOSIS — M19011 Primary osteoarthritis, right shoulder: Secondary | ICD-10-CM | POA: Diagnosis not present

## 2017-10-29 DIAGNOSIS — Z96611 Presence of right artificial shoulder joint: Secondary | ICD-10-CM | POA: Insufficient documentation

## 2017-10-29 DIAGNOSIS — Z471 Aftercare following joint replacement surgery: Secondary | ICD-10-CM | POA: Diagnosis not present

## 2017-11-03 DIAGNOSIS — M25632 Stiffness of left wrist, not elsewhere classified: Secondary | ICD-10-CM | POA: Diagnosis not present

## 2017-11-03 DIAGNOSIS — M25532 Pain in left wrist: Secondary | ICD-10-CM | POA: Diagnosis not present

## 2017-11-04 ENCOUNTER — Ambulatory Visit
Admission: RE | Admit: 2017-11-04 | Discharge: 2017-11-04 | Disposition: A | Discharge status: Home / Self Care | Payer: Medicare HMO | Source: Ambulatory Visit | Attending: Physician Assistant | Admitting: Physician Assistant

## 2017-11-04 DIAGNOSIS — Z1231 Encounter for screening mammogram for malignant neoplasm of breast: Secondary | ICD-10-CM | POA: Diagnosis not present

## 2017-11-14 DIAGNOSIS — I10 Essential (primary) hypertension: Secondary | ICD-10-CM | POA: Diagnosis not present

## 2017-11-14 DIAGNOSIS — Z6823 Body mass index (BMI) 23.0-23.9, adult: Secondary | ICD-10-CM | POA: Diagnosis not present

## 2017-11-14 DIAGNOSIS — J189 Pneumonia, unspecified organism: Secondary | ICD-10-CM | POA: Diagnosis not present

## 2017-11-14 DIAGNOSIS — R410 Disorientation, unspecified: Secondary | ICD-10-CM | POA: Diagnosis not present

## 2017-11-17 DIAGNOSIS — Z6823 Body mass index (BMI) 23.0-23.9, adult: Secondary | ICD-10-CM | POA: Diagnosis not present

## 2017-11-17 DIAGNOSIS — J189 Pneumonia, unspecified organism: Secondary | ICD-10-CM | POA: Diagnosis not present

## 2017-11-17 DIAGNOSIS — R509 Fever, unspecified: Secondary | ICD-10-CM | POA: Diagnosis not present

## 2017-11-18 DIAGNOSIS — J189 Pneumonia, unspecified organism: Secondary | ICD-10-CM | POA: Diagnosis not present

## 2017-11-19 DIAGNOSIS — Z6822 Body mass index (BMI) 22.0-22.9, adult: Secondary | ICD-10-CM | POA: Diagnosis not present

## 2017-11-19 DIAGNOSIS — R509 Fever, unspecified: Secondary | ICD-10-CM | POA: Diagnosis not present

## 2017-11-19 DIAGNOSIS — E860 Dehydration: Secondary | ICD-10-CM | POA: Diagnosis not present

## 2018-01-01 DIAGNOSIS — Z0000 Encounter for general adult medical examination without abnormal findings: Secondary | ICD-10-CM | POA: Diagnosis not present

## 2018-01-01 DIAGNOSIS — E785 Hyperlipidemia, unspecified: Secondary | ICD-10-CM | POA: Diagnosis not present

## 2018-01-01 DIAGNOSIS — Z9181 History of falling: Secondary | ICD-10-CM | POA: Diagnosis not present

## 2018-01-01 DIAGNOSIS — Z136 Encounter for screening for cardiovascular disorders: Secondary | ICD-10-CM | POA: Diagnosis not present

## 2018-01-01 DIAGNOSIS — Z1339 Encounter for screening examination for other mental health and behavioral disorders: Secondary | ICD-10-CM | POA: Diagnosis not present

## 2018-01-01 DIAGNOSIS — Z1331 Encounter for screening for depression: Secondary | ICD-10-CM | POA: Diagnosis not present

## 2018-01-01 DIAGNOSIS — Z139 Encounter for screening, unspecified: Secondary | ICD-10-CM | POA: Diagnosis not present

## 2018-01-16 DIAGNOSIS — I10 Essential (primary) hypertension: Secondary | ICD-10-CM | POA: Diagnosis not present

## 2018-01-16 DIAGNOSIS — M159 Polyosteoarthritis, unspecified: Secondary | ICD-10-CM | POA: Diagnosis not present

## 2018-01-16 DIAGNOSIS — E7800 Pure hypercholesterolemia, unspecified: Secondary | ICD-10-CM | POA: Diagnosis not present

## 2018-01-16 DIAGNOSIS — Z79899 Other long term (current) drug therapy: Secondary | ICD-10-CM | POA: Diagnosis not present

## 2018-01-16 DIAGNOSIS — R69 Illness, unspecified: Secondary | ICD-10-CM | POA: Diagnosis not present

## 2018-02-16 DIAGNOSIS — R69 Illness, unspecified: Secondary | ICD-10-CM | POA: Diagnosis not present

## 2018-02-16 DIAGNOSIS — J209 Acute bronchitis, unspecified: Secondary | ICD-10-CM | POA: Diagnosis not present

## 2018-02-23 DIAGNOSIS — H1030 Unspecified acute conjunctivitis, unspecified eye: Secondary | ICD-10-CM | POA: Diagnosis not present

## 2018-02-23 DIAGNOSIS — Z6823 Body mass index (BMI) 23.0-23.9, adult: Secondary | ICD-10-CM | POA: Diagnosis not present

## 2018-03-17 DIAGNOSIS — R69 Illness, unspecified: Secondary | ICD-10-CM | POA: Diagnosis not present

## 2018-04-21 DIAGNOSIS — M159 Polyosteoarthritis, unspecified: Secondary | ICD-10-CM | POA: Diagnosis not present

## 2018-04-21 DIAGNOSIS — I10 Essential (primary) hypertension: Secondary | ICD-10-CM | POA: Diagnosis not present

## 2018-04-21 DIAGNOSIS — Z6823 Body mass index (BMI) 23.0-23.9, adult: Secondary | ICD-10-CM | POA: Diagnosis not present

## 2018-04-21 DIAGNOSIS — R69 Illness, unspecified: Secondary | ICD-10-CM | POA: Diagnosis not present

## 2018-07-22 DIAGNOSIS — R69 Illness, unspecified: Secondary | ICD-10-CM | POA: Diagnosis not present

## 2018-07-22 DIAGNOSIS — Z79899 Other long term (current) drug therapy: Secondary | ICD-10-CM | POA: Diagnosis not present

## 2018-07-22 DIAGNOSIS — Z1331 Encounter for screening for depression: Secondary | ICD-10-CM | POA: Diagnosis not present

## 2018-07-22 DIAGNOSIS — M159 Polyosteoarthritis, unspecified: Secondary | ICD-10-CM | POA: Diagnosis not present

## 2018-07-22 DIAGNOSIS — E7800 Pure hypercholesterolemia, unspecified: Secondary | ICD-10-CM | POA: Diagnosis not present

## 2018-07-22 DIAGNOSIS — Z6828 Body mass index (BMI) 28.0-28.9, adult: Secondary | ICD-10-CM | POA: Diagnosis not present

## 2018-07-22 DIAGNOSIS — I10 Essential (primary) hypertension: Secondary | ICD-10-CM | POA: Diagnosis not present

## 2018-10-20 DIAGNOSIS — I10 Essential (primary) hypertension: Secondary | ICD-10-CM | POA: Diagnosis not present

## 2018-10-20 DIAGNOSIS — M159 Polyosteoarthritis, unspecified: Secondary | ICD-10-CM | POA: Diagnosis not present

## 2018-10-20 DIAGNOSIS — R69 Illness, unspecified: Secondary | ICD-10-CM | POA: Diagnosis not present

## 2018-10-20 DIAGNOSIS — Z6823 Body mass index (BMI) 23.0-23.9, adult: Secondary | ICD-10-CM | POA: Diagnosis not present

## 2018-12-22 DIAGNOSIS — R69 Illness, unspecified: Secondary | ICD-10-CM | POA: Diagnosis not present

## 2018-12-22 DIAGNOSIS — G20 Parkinson's disease: Secondary | ICD-10-CM | POA: Diagnosis not present

## 2018-12-22 DIAGNOSIS — R262 Difficulty in walking, not elsewhere classified: Secondary | ICD-10-CM | POA: Diagnosis not present

## 2019-01-04 DIAGNOSIS — Z1331 Encounter for screening for depression: Secondary | ICD-10-CM | POA: Diagnosis not present

## 2019-01-04 DIAGNOSIS — Z0000 Encounter for general adult medical examination without abnormal findings: Secondary | ICD-10-CM | POA: Diagnosis not present

## 2019-01-04 DIAGNOSIS — N950 Postmenopausal bleeding: Secondary | ICD-10-CM | POA: Diagnosis not present

## 2019-01-04 DIAGNOSIS — Z1231 Encounter for screening mammogram for malignant neoplasm of breast: Secondary | ICD-10-CM | POA: Diagnosis not present

## 2019-01-04 DIAGNOSIS — E785 Hyperlipidemia, unspecified: Secondary | ICD-10-CM | POA: Diagnosis not present

## 2019-01-04 DIAGNOSIS — Z9181 History of falling: Secondary | ICD-10-CM | POA: Diagnosis not present

## 2019-01-25 DIAGNOSIS — R69 Illness, unspecified: Secondary | ICD-10-CM | POA: Diagnosis not present

## 2019-01-25 DIAGNOSIS — Z6823 Body mass index (BMI) 23.0-23.9, adult: Secondary | ICD-10-CM | POA: Diagnosis not present

## 2019-01-25 DIAGNOSIS — R61 Generalized hyperhidrosis: Secondary | ICD-10-CM | POA: Diagnosis not present

## 2019-01-25 DIAGNOSIS — M159 Polyosteoarthritis, unspecified: Secondary | ICD-10-CM | POA: Diagnosis not present

## 2019-01-25 DIAGNOSIS — E7800 Pure hypercholesterolemia, unspecified: Secondary | ICD-10-CM | POA: Diagnosis not present

## 2019-01-25 DIAGNOSIS — Z79899 Other long term (current) drug therapy: Secondary | ICD-10-CM | POA: Diagnosis not present

## 2019-01-25 DIAGNOSIS — I10 Essential (primary) hypertension: Secondary | ICD-10-CM | POA: Diagnosis not present

## 2019-01-25 DIAGNOSIS — J019 Acute sinusitis, unspecified: Secondary | ICD-10-CM | POA: Diagnosis not present

## 2019-01-25 DIAGNOSIS — Z139 Encounter for screening, unspecified: Secondary | ICD-10-CM | POA: Diagnosis not present

## 2019-01-26 DIAGNOSIS — R69 Illness, unspecified: Secondary | ICD-10-CM | POA: Diagnosis not present

## 2019-04-05 DIAGNOSIS — K219 Gastro-esophageal reflux disease without esophagitis: Secondary | ICD-10-CM | POA: Diagnosis not present

## 2019-04-05 DIAGNOSIS — M1990 Unspecified osteoarthritis, unspecified site: Secondary | ICD-10-CM | POA: Diagnosis not present

## 2019-04-05 DIAGNOSIS — E785 Hyperlipidemia, unspecified: Secondary | ICD-10-CM | POA: Diagnosis not present

## 2019-04-05 DIAGNOSIS — R69 Illness, unspecified: Secondary | ICD-10-CM | POA: Diagnosis not present

## 2019-04-05 DIAGNOSIS — G8929 Other chronic pain: Secondary | ICD-10-CM | POA: Diagnosis not present

## 2019-04-05 DIAGNOSIS — Z791 Long term (current) use of non-steroidal anti-inflammatories (NSAID): Secondary | ICD-10-CM | POA: Diagnosis not present

## 2019-04-05 DIAGNOSIS — I10 Essential (primary) hypertension: Secondary | ICD-10-CM | POA: Diagnosis not present

## 2019-04-05 DIAGNOSIS — Z720 Tobacco use: Secondary | ICD-10-CM | POA: Diagnosis not present

## 2019-04-05 DIAGNOSIS — F419 Anxiety disorder, unspecified: Secondary | ICD-10-CM | POA: Diagnosis not present

## 2019-04-05 DIAGNOSIS — G4700 Insomnia, unspecified: Secondary | ICD-10-CM | POA: Diagnosis not present

## 2019-04-27 DIAGNOSIS — M159 Polyosteoarthritis, unspecified: Secondary | ICD-10-CM | POA: Diagnosis not present

## 2019-04-27 DIAGNOSIS — Z6823 Body mass index (BMI) 23.0-23.9, adult: Secondary | ICD-10-CM | POA: Diagnosis not present

## 2019-04-27 DIAGNOSIS — R69 Illness, unspecified: Secondary | ICD-10-CM | POA: Diagnosis not present

## 2019-04-27 DIAGNOSIS — E7800 Pure hypercholesterolemia, unspecified: Secondary | ICD-10-CM | POA: Diagnosis not present

## 2019-04-27 DIAGNOSIS — I10 Essential (primary) hypertension: Secondary | ICD-10-CM | POA: Diagnosis not present

## 2019-04-27 DIAGNOSIS — K219 Gastro-esophageal reflux disease without esophagitis: Secondary | ICD-10-CM | POA: Diagnosis not present

## 2019-08-04 DIAGNOSIS — R69 Illness, unspecified: Secondary | ICD-10-CM | POA: Diagnosis not present

## 2019-08-04 DIAGNOSIS — Z1231 Encounter for screening mammogram for malignant neoplasm of breast: Secondary | ICD-10-CM | POA: Diagnosis not present

## 2019-08-04 DIAGNOSIS — Z79899 Other long term (current) drug therapy: Secondary | ICD-10-CM | POA: Diagnosis not present

## 2019-08-04 DIAGNOSIS — E7800 Pure hypercholesterolemia, unspecified: Secondary | ICD-10-CM | POA: Diagnosis not present

## 2019-08-04 DIAGNOSIS — N390 Urinary tract infection, site not specified: Secondary | ICD-10-CM | POA: Diagnosis not present

## 2019-08-04 DIAGNOSIS — Z6822 Body mass index (BMI) 22.0-22.9, adult: Secondary | ICD-10-CM | POA: Diagnosis not present

## 2019-08-04 DIAGNOSIS — M159 Polyosteoarthritis, unspecified: Secondary | ICD-10-CM | POA: Diagnosis not present

## 2019-08-04 DIAGNOSIS — I10 Essential (primary) hypertension: Secondary | ICD-10-CM | POA: Diagnosis not present

## 2019-08-12 ENCOUNTER — Other Ambulatory Visit: Payer: Self-pay | Admitting: Physician Assistant

## 2019-08-12 DIAGNOSIS — Z1231 Encounter for screening mammogram for malignant neoplasm of breast: Secondary | ICD-10-CM

## 2019-08-18 DIAGNOSIS — H25013 Cortical age-related cataract, bilateral: Secondary | ICD-10-CM | POA: Diagnosis not present

## 2019-08-18 DIAGNOSIS — H2513 Age-related nuclear cataract, bilateral: Secondary | ICD-10-CM | POA: Diagnosis not present

## 2019-08-18 DIAGNOSIS — H2511 Age-related nuclear cataract, right eye: Secondary | ICD-10-CM | POA: Diagnosis not present

## 2019-08-18 DIAGNOSIS — H25043 Posterior subcapsular polar age-related cataract, bilateral: Secondary | ICD-10-CM | POA: Diagnosis not present

## 2019-08-18 DIAGNOSIS — I10 Essential (primary) hypertension: Secondary | ICD-10-CM | POA: Diagnosis not present

## 2019-08-24 DIAGNOSIS — H25012 Cortical age-related cataract, left eye: Secondary | ICD-10-CM | POA: Diagnosis not present

## 2019-08-24 DIAGNOSIS — H2512 Age-related nuclear cataract, left eye: Secondary | ICD-10-CM | POA: Diagnosis not present

## 2019-08-24 DIAGNOSIS — H25811 Combined forms of age-related cataract, right eye: Secondary | ICD-10-CM | POA: Diagnosis not present

## 2019-08-24 DIAGNOSIS — H2511 Age-related nuclear cataract, right eye: Secondary | ICD-10-CM | POA: Diagnosis not present

## 2019-08-24 DIAGNOSIS — H25042 Posterior subcapsular polar age-related cataract, left eye: Secondary | ICD-10-CM | POA: Diagnosis not present

## 2019-09-07 DIAGNOSIS — H2512 Age-related nuclear cataract, left eye: Secondary | ICD-10-CM | POA: Diagnosis not present

## 2019-09-14 ENCOUNTER — Ambulatory Visit
Admission: RE | Admit: 2019-09-14 | Discharge: 2019-09-14 | Disposition: A | Discharge status: Home / Self Care | Payer: Medicare HMO | Source: Ambulatory Visit | Attending: Physician Assistant | Admitting: Physician Assistant

## 2019-09-14 DIAGNOSIS — Z1231 Encounter for screening mammogram for malignant neoplasm of breast: Secondary | ICD-10-CM | POA: Insufficient documentation

## 2019-09-24 DIAGNOSIS — H2511 Age-related nuclear cataract, right eye: Secondary | ICD-10-CM | POA: Diagnosis not present

## 2019-09-27 DIAGNOSIS — Z6822 Body mass index (BMI) 22.0-22.9, adult: Secondary | ICD-10-CM | POA: Diagnosis not present

## 2019-09-27 DIAGNOSIS — R300 Dysuria: Secondary | ICD-10-CM | POA: Diagnosis not present

## 2019-10-24 DIAGNOSIS — M1990 Unspecified osteoarthritis, unspecified site: Secondary | ICD-10-CM | POA: Diagnosis not present

## 2019-10-24 DIAGNOSIS — G4700 Insomnia, unspecified: Secondary | ICD-10-CM | POA: Diagnosis not present

## 2019-10-24 DIAGNOSIS — Z008 Encounter for other general examination: Secondary | ICD-10-CM | POA: Diagnosis not present

## 2019-10-24 DIAGNOSIS — K219 Gastro-esophageal reflux disease without esophagitis: Secondary | ICD-10-CM | POA: Diagnosis not present

## 2019-10-24 DIAGNOSIS — I10 Essential (primary) hypertension: Secondary | ICD-10-CM | POA: Diagnosis not present

## 2019-10-24 DIAGNOSIS — M069 Rheumatoid arthritis, unspecified: Secondary | ICD-10-CM | POA: Diagnosis not present

## 2019-10-24 DIAGNOSIS — I739 Peripheral vascular disease, unspecified: Secondary | ICD-10-CM | POA: Diagnosis not present

## 2019-10-24 DIAGNOSIS — E785 Hyperlipidemia, unspecified: Secondary | ICD-10-CM | POA: Diagnosis not present

## 2019-10-24 DIAGNOSIS — G8929 Other chronic pain: Secondary | ICD-10-CM | POA: Diagnosis not present

## 2019-10-24 DIAGNOSIS — R69 Illness, unspecified: Secondary | ICD-10-CM | POA: Diagnosis not present

## 2019-11-05 DIAGNOSIS — E7800 Pure hypercholesterolemia, unspecified: Secondary | ICD-10-CM | POA: Diagnosis not present

## 2019-11-05 DIAGNOSIS — K219 Gastro-esophageal reflux disease without esophagitis: Secondary | ICD-10-CM | POA: Diagnosis not present

## 2019-11-05 DIAGNOSIS — I10 Essential (primary) hypertension: Secondary | ICD-10-CM | POA: Diagnosis not present

## 2019-11-05 DIAGNOSIS — R69 Illness, unspecified: Secondary | ICD-10-CM | POA: Diagnosis not present

## 2019-11-05 DIAGNOSIS — R5383 Other fatigue: Secondary | ICD-10-CM | POA: Diagnosis not present

## 2019-11-05 DIAGNOSIS — Z6823 Body mass index (BMI) 23.0-23.9, adult: Secondary | ICD-10-CM | POA: Diagnosis not present

## 2019-11-05 DIAGNOSIS — M159 Polyosteoarthritis, unspecified: Secondary | ICD-10-CM | POA: Diagnosis not present

## 2019-11-10 DIAGNOSIS — R69 Illness, unspecified: Secondary | ICD-10-CM | POA: Diagnosis not present

## 2019-11-16 IMAGING — MG MM DIGITAL SCREENING IMPLANTS W/ CAD
8 series · 8 of 8 positions shown · non-contrast
Comparison: Previous exam(s).

CLINICAL DATA: Screening.

EXAM:
DIGITAL SCREENING BILATERAL MAMMOGRAM WITH IMPLANTS AND CAD
The patient has retropectoral implants. Standard and implant
displaced views were performed.

[R CC (1 of 2)]
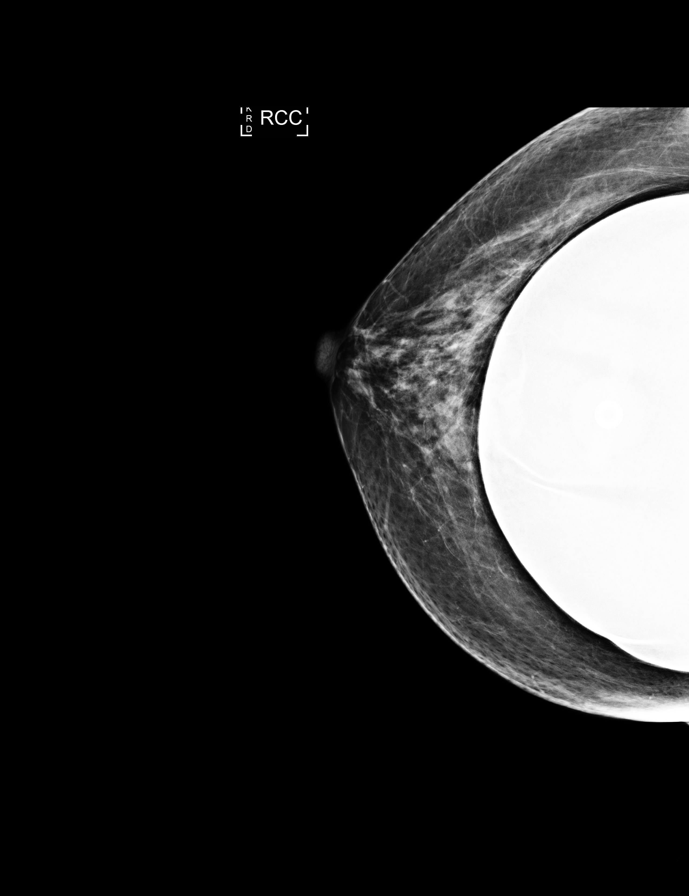

[L CC (1 of 2)]
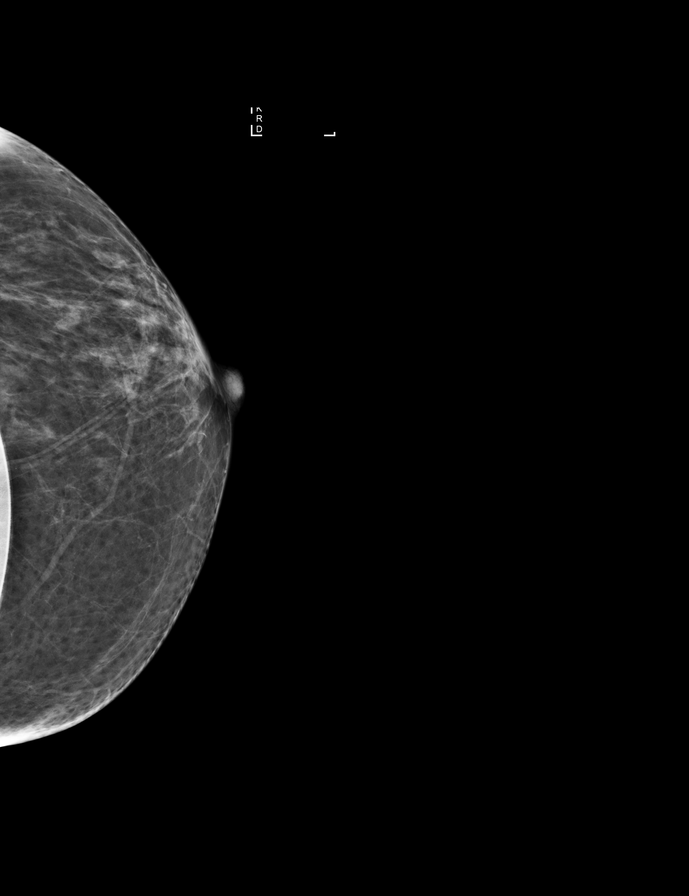

[L MLO (1 of 2)]
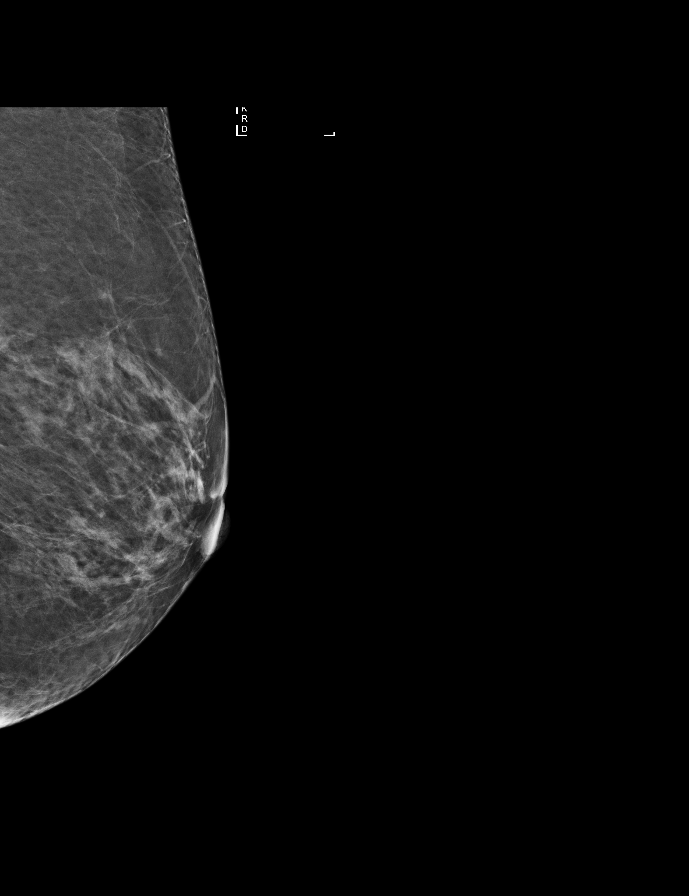

[L MLO (2 of 2)]
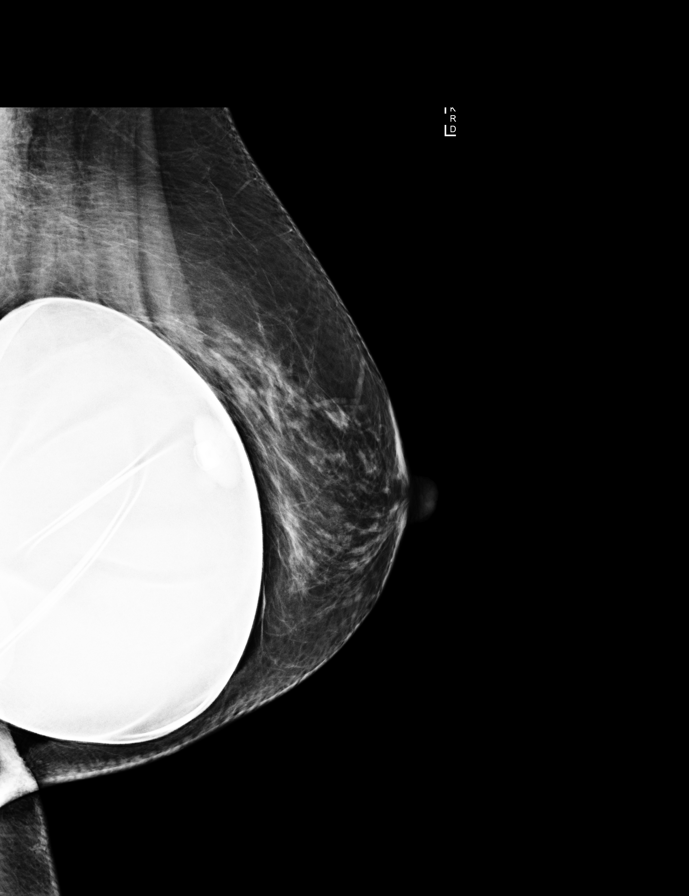

[R MLO (1 of 2)]
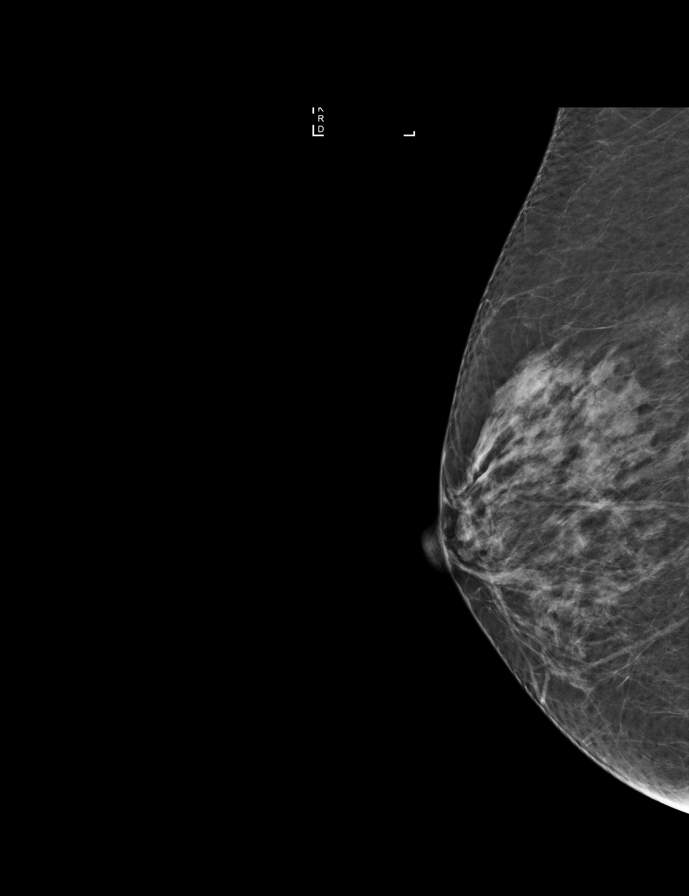

[R MLO (2 of 2)]
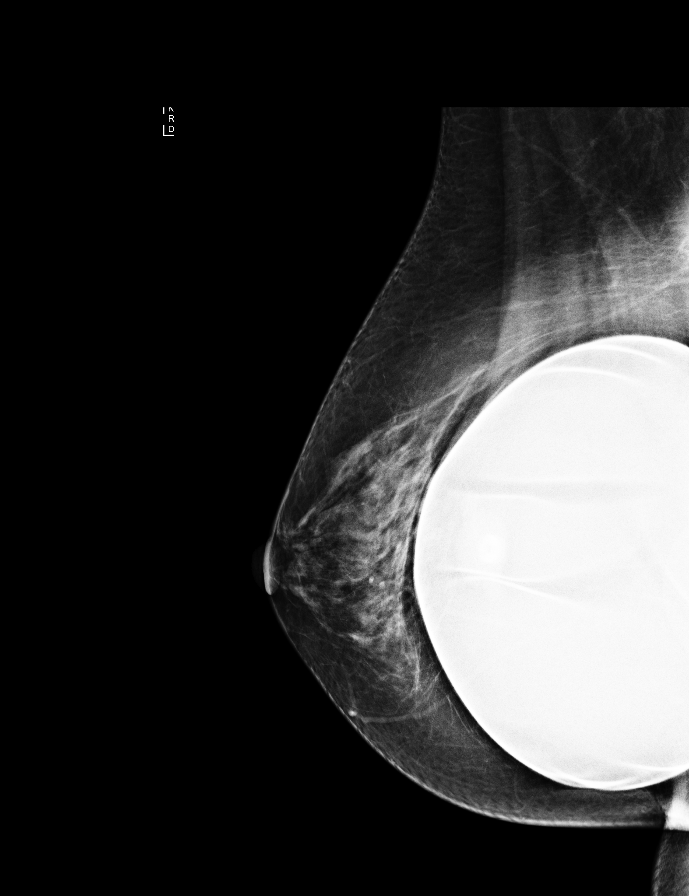

[L CC (2 of 2)]
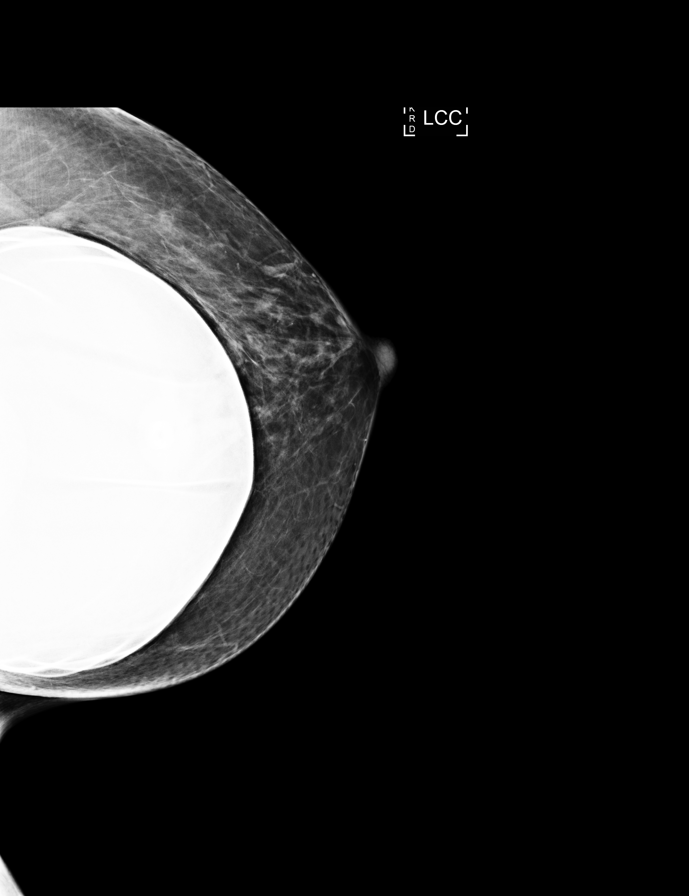

[R CC (2 of 2)]
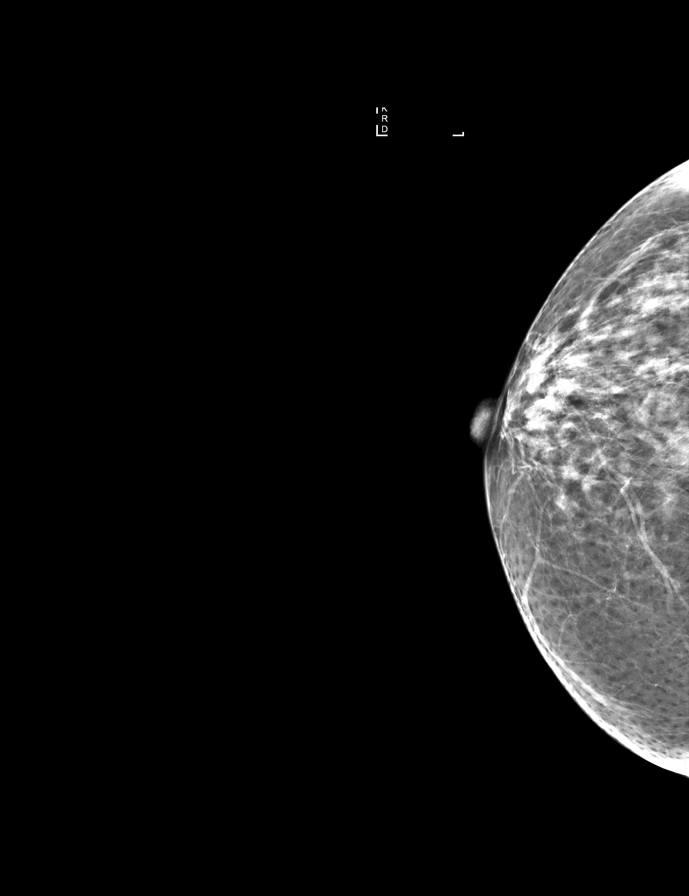

[8 of 8 positions shown; findings below may reference images not displayed]

ACR Breast Density Category c: The breast tissue is heterogeneously
dense, which may obscure small masses.
FINDINGS: There are no findings suspicious for malignancy. Images were
processed with CAD.
IMPRESSION: No mammographic evidence of malignancy. A result letter of this
screening mammogram will be mailed directly to the patient.

RECOMMENDATION:
Screening mammogram in one year. (Code:R9-8-RFM)

BI-RADS CATEGORY  1:  Negative.

## 2019-12-10 DIAGNOSIS — H26492 Other secondary cataract, left eye: Secondary | ICD-10-CM | POA: Diagnosis not present

## 2020-01-12 DIAGNOSIS — Z961 Presence of intraocular lens: Secondary | ICD-10-CM | POA: Diagnosis not present

## 2020-01-12 DIAGNOSIS — I10 Essential (primary) hypertension: Secondary | ICD-10-CM | POA: Diagnosis not present

## 2020-01-12 DIAGNOSIS — H26492 Other secondary cataract, left eye: Secondary | ICD-10-CM | POA: Diagnosis not present

## 2020-01-18 DIAGNOSIS — H26492 Other secondary cataract, left eye: Secondary | ICD-10-CM | POA: Diagnosis not present

## 2020-01-26 DIAGNOSIS — H26491 Other secondary cataract, right eye: Secondary | ICD-10-CM | POA: Diagnosis not present

## 2020-02-03 DIAGNOSIS — H26491 Other secondary cataract, right eye: Secondary | ICD-10-CM | POA: Diagnosis not present

## 2020-02-08 DIAGNOSIS — Z6823 Body mass index (BMI) 23.0-23.9, adult: Secondary | ICD-10-CM | POA: Diagnosis not present

## 2020-02-08 DIAGNOSIS — E7800 Pure hypercholesterolemia, unspecified: Secondary | ICD-10-CM | POA: Diagnosis not present

## 2020-02-08 DIAGNOSIS — R69 Illness, unspecified: Secondary | ICD-10-CM | POA: Diagnosis not present

## 2020-02-08 DIAGNOSIS — I10 Essential (primary) hypertension: Secondary | ICD-10-CM | POA: Diagnosis not present

## 2020-02-08 DIAGNOSIS — Z9181 History of falling: Secondary | ICD-10-CM | POA: Diagnosis not present

## 2020-02-08 DIAGNOSIS — Z139 Encounter for screening, unspecified: Secondary | ICD-10-CM | POA: Diagnosis not present

## 2020-02-08 DIAGNOSIS — M159 Polyosteoarthritis, unspecified: Secondary | ICD-10-CM | POA: Diagnosis not present

## 2020-02-08 DIAGNOSIS — Z79899 Other long term (current) drug therapy: Secondary | ICD-10-CM | POA: Diagnosis not present

## 2020-02-11 DIAGNOSIS — Z471 Aftercare following joint replacement surgery: Secondary | ICD-10-CM | POA: Diagnosis not present

## 2020-02-11 DIAGNOSIS — Z96611 Presence of right artificial shoulder joint: Secondary | ICD-10-CM | POA: Diagnosis not present

## 2020-04-28 DIAGNOSIS — R69 Illness, unspecified: Secondary | ICD-10-CM | POA: Diagnosis not present

## 2020-05-11 DIAGNOSIS — E7800 Pure hypercholesterolemia, unspecified: Secondary | ICD-10-CM | POA: Diagnosis not present

## 2020-05-11 DIAGNOSIS — E0590 Thyrotoxicosis, unspecified without thyrotoxic crisis or storm: Secondary | ICD-10-CM | POA: Diagnosis not present

## 2020-05-11 DIAGNOSIS — I10 Essential (primary) hypertension: Secondary | ICD-10-CM | POA: Diagnosis not present

## 2020-05-11 DIAGNOSIS — Z6822 Body mass index (BMI) 22.0-22.9, adult: Secondary | ICD-10-CM | POA: Diagnosis not present

## 2020-05-11 DIAGNOSIS — M159 Polyosteoarthritis, unspecified: Secondary | ICD-10-CM | POA: Diagnosis not present

## 2020-05-11 DIAGNOSIS — K219 Gastro-esophageal reflux disease without esophagitis: Secondary | ICD-10-CM | POA: Diagnosis not present

## 2020-05-11 DIAGNOSIS — R69 Illness, unspecified: Secondary | ICD-10-CM | POA: Diagnosis not present

## 2020-05-11 DIAGNOSIS — N183 Chronic kidney disease, stage 3 unspecified: Secondary | ICD-10-CM | POA: Diagnosis not present

## 2020-05-11 DIAGNOSIS — R5383 Other fatigue: Secondary | ICD-10-CM | POA: Diagnosis not present

## 2020-05-17 DIAGNOSIS — R69 Illness, unspecified: Secondary | ICD-10-CM | POA: Diagnosis not present

## 2020-07-10 DIAGNOSIS — M810 Age-related osteoporosis without current pathological fracture: Secondary | ICD-10-CM | POA: Diagnosis not present

## 2020-07-10 DIAGNOSIS — E785 Hyperlipidemia, unspecified: Secondary | ICD-10-CM | POA: Diagnosis not present

## 2020-07-10 DIAGNOSIS — Z008 Encounter for other general examination: Secondary | ICD-10-CM | POA: Diagnosis not present

## 2020-07-10 DIAGNOSIS — K219 Gastro-esophageal reflux disease without esophagitis: Secondary | ICD-10-CM | POA: Diagnosis not present

## 2020-07-10 DIAGNOSIS — I10 Essential (primary) hypertension: Secondary | ICD-10-CM | POA: Diagnosis not present

## 2020-07-10 DIAGNOSIS — R69 Illness, unspecified: Secondary | ICD-10-CM | POA: Diagnosis not present

## 2020-07-10 DIAGNOSIS — R32 Unspecified urinary incontinence: Secondary | ICD-10-CM | POA: Diagnosis not present

## 2020-07-10 DIAGNOSIS — M1990 Unspecified osteoarthritis, unspecified site: Secondary | ICD-10-CM | POA: Diagnosis not present

## 2020-07-10 DIAGNOSIS — G8929 Other chronic pain: Secondary | ICD-10-CM | POA: Diagnosis not present

## 2020-07-10 DIAGNOSIS — G4700 Insomnia, unspecified: Secondary | ICD-10-CM | POA: Diagnosis not present

## 2020-08-11 DIAGNOSIS — R69 Illness, unspecified: Secondary | ICD-10-CM | POA: Diagnosis not present

## 2020-08-11 DIAGNOSIS — M159 Polyosteoarthritis, unspecified: Secondary | ICD-10-CM | POA: Diagnosis not present

## 2020-08-11 DIAGNOSIS — I10 Essential (primary) hypertension: Secondary | ICD-10-CM | POA: Diagnosis not present

## 2020-08-11 DIAGNOSIS — Z6822 Body mass index (BMI) 22.0-22.9, adult: Secondary | ICD-10-CM | POA: Diagnosis not present

## 2020-08-11 DIAGNOSIS — E7800 Pure hypercholesterolemia, unspecified: Secondary | ICD-10-CM | POA: Diagnosis not present

## 2020-10-11 ENCOUNTER — Other Ambulatory Visit: Payer: Self-pay | Admitting: Physician Assistant

## 2020-10-11 DIAGNOSIS — Z1231 Encounter for screening mammogram for malignant neoplasm of breast: Secondary | ICD-10-CM

## 2020-10-19 ENCOUNTER — Ambulatory Visit
Admission: RE | Admit: 2020-10-19 | Discharge: 2020-10-19 | Disposition: A | Discharge status: Home / Self Care | Payer: Medicare HMO | Source: Ambulatory Visit | Attending: Physician Assistant | Admitting: Physician Assistant

## 2020-10-19 ENCOUNTER — Other Ambulatory Visit: Payer: Self-pay

## 2020-10-19 DIAGNOSIS — Z1231 Encounter for screening mammogram for malignant neoplasm of breast: Secondary | ICD-10-CM | POA: Diagnosis not present

## 2020-10-23 ENCOUNTER — Other Ambulatory Visit: Payer: Self-pay | Admitting: Physician Assistant

## 2020-10-24 ENCOUNTER — Other Ambulatory Visit: Payer: Self-pay | Admitting: Physician Assistant

## 2020-10-24 DIAGNOSIS — R928 Other abnormal and inconclusive findings on diagnostic imaging of breast: Secondary | ICD-10-CM

## 2020-10-24 DIAGNOSIS — N6489 Other specified disorders of breast: Secondary | ICD-10-CM

## 2020-10-26 ENCOUNTER — Ambulatory Visit
Admission: RE | Admit: 2020-10-26 | Discharge: 2020-10-26 | Disposition: A | Discharge status: Home / Self Care | Payer: Medicare HMO | Source: Ambulatory Visit | Attending: Physician Assistant | Admitting: Physician Assistant

## 2020-10-26 ENCOUNTER — Other Ambulatory Visit: Payer: Self-pay

## 2020-10-26 DIAGNOSIS — R928 Other abnormal and inconclusive findings on diagnostic imaging of breast: Secondary | ICD-10-CM | POA: Diagnosis not present

## 2020-10-26 DIAGNOSIS — N6489 Other specified disorders of breast: Secondary | ICD-10-CM

## 2020-11-13 DIAGNOSIS — Z6822 Body mass index (BMI) 22.0-22.9, adult: Secondary | ICD-10-CM | POA: Diagnosis not present

## 2020-11-13 DIAGNOSIS — R5383 Other fatigue: Secondary | ICD-10-CM | POA: Diagnosis not present

## 2020-11-13 DIAGNOSIS — R69 Illness, unspecified: Secondary | ICD-10-CM | POA: Diagnosis not present

## 2020-11-13 DIAGNOSIS — I10 Essential (primary) hypertension: Secondary | ICD-10-CM | POA: Diagnosis not present

## 2020-11-13 DIAGNOSIS — M159 Polyosteoarthritis, unspecified: Secondary | ICD-10-CM | POA: Diagnosis not present

## 2020-11-13 DIAGNOSIS — E7800 Pure hypercholesterolemia, unspecified: Secondary | ICD-10-CM | POA: Diagnosis not present

## 2020-11-20 DIAGNOSIS — R0602 Shortness of breath: Secondary | ICD-10-CM | POA: Diagnosis not present

## 2020-11-20 DIAGNOSIS — G4733 Obstructive sleep apnea (adult) (pediatric): Secondary | ICD-10-CM | POA: Diagnosis not present

## 2020-11-21 DIAGNOSIS — R0602 Shortness of breath: Secondary | ICD-10-CM | POA: Diagnosis not present

## 2020-11-21 DIAGNOSIS — G4733 Obstructive sleep apnea (adult) (pediatric): Secondary | ICD-10-CM | POA: Diagnosis not present

## 2021-02-20 DIAGNOSIS — Z23 Encounter for immunization: Secondary | ICD-10-CM | POA: Diagnosis not present

## 2021-02-20 DIAGNOSIS — Z139 Encounter for screening, unspecified: Secondary | ICD-10-CM | POA: Diagnosis not present

## 2021-02-20 DIAGNOSIS — M159 Polyosteoarthritis, unspecified: Secondary | ICD-10-CM | POA: Diagnosis not present

## 2021-02-20 DIAGNOSIS — Z6822 Body mass index (BMI) 22.0-22.9, adult: Secondary | ICD-10-CM | POA: Diagnosis not present

## 2021-02-20 DIAGNOSIS — Z79899 Other long term (current) drug therapy: Secondary | ICD-10-CM | POA: Diagnosis not present

## 2021-02-20 DIAGNOSIS — Z9181 History of falling: Secondary | ICD-10-CM | POA: Diagnosis not present

## 2021-02-20 DIAGNOSIS — N183 Chronic kidney disease, stage 3 unspecified: Secondary | ICD-10-CM | POA: Diagnosis not present

## 2021-02-20 DIAGNOSIS — E7800 Pure hypercholesterolemia, unspecified: Secondary | ICD-10-CM | POA: Diagnosis not present

## 2021-02-20 DIAGNOSIS — Z1331 Encounter for screening for depression: Secondary | ICD-10-CM | POA: Diagnosis not present

## 2021-02-20 DIAGNOSIS — I10 Essential (primary) hypertension: Secondary | ICD-10-CM | POA: Diagnosis not present

## 2021-02-20 DIAGNOSIS — R69 Illness, unspecified: Secondary | ICD-10-CM | POA: Diagnosis not present

## 2021-03-07 DIAGNOSIS — J189 Pneumonia, unspecified organism: Secondary | ICD-10-CM | POA: Diagnosis not present

## 2021-03-07 DIAGNOSIS — Z20822 Contact with and (suspected) exposure to covid-19: Secondary | ICD-10-CM | POA: Diagnosis not present

## 2021-03-07 DIAGNOSIS — R6889 Other general symptoms and signs: Secondary | ICD-10-CM | POA: Diagnosis not present

## 2021-04-20 DIAGNOSIS — M898X9 Other specified disorders of bone, unspecified site: Secondary | ICD-10-CM | POA: Diagnosis not present

## 2021-04-20 DIAGNOSIS — M2042 Other hammer toe(s) (acquired), left foot: Secondary | ICD-10-CM | POA: Diagnosis not present

## 2021-04-20 DIAGNOSIS — M19071 Primary osteoarthritis, right ankle and foot: Secondary | ICD-10-CM | POA: Diagnosis not present

## 2021-04-20 DIAGNOSIS — M2031 Hallux varus (acquired), right foot: Secondary | ICD-10-CM | POA: Diagnosis not present

## 2021-04-20 DIAGNOSIS — M19072 Primary osteoarthritis, left ankle and foot: Secondary | ICD-10-CM | POA: Diagnosis not present

## 2021-04-20 DIAGNOSIS — M2032 Hallux varus (acquired), left foot: Secondary | ICD-10-CM | POA: Diagnosis not present

## 2021-04-20 DIAGNOSIS — M2041 Other hammer toe(s) (acquired), right foot: Secondary | ICD-10-CM | POA: Diagnosis not present

## 2021-06-05 DIAGNOSIS — F418 Other specified anxiety disorders: Secondary | ICD-10-CM | POA: Diagnosis not present

## 2021-06-05 DIAGNOSIS — I10 Essential (primary) hypertension: Secondary | ICD-10-CM | POA: Diagnosis not present

## 2021-06-05 DIAGNOSIS — E7800 Pure hypercholesterolemia, unspecified: Secondary | ICD-10-CM | POA: Diagnosis not present

## 2021-06-05 DIAGNOSIS — R69 Illness, unspecified: Secondary | ICD-10-CM | POA: Diagnosis not present

## 2021-06-05 DIAGNOSIS — N183 Chronic kidney disease, stage 3 unspecified: Secondary | ICD-10-CM | POA: Diagnosis not present

## 2021-06-05 DIAGNOSIS — M159 Polyosteoarthritis, unspecified: Secondary | ICD-10-CM | POA: Diagnosis not present

## 2021-06-05 DIAGNOSIS — Z6821 Body mass index (BMI) 21.0-21.9, adult: Secondary | ICD-10-CM | POA: Diagnosis not present

## 2021-08-20 DIAGNOSIS — F419 Anxiety disorder, unspecified: Secondary | ICD-10-CM | POA: Diagnosis not present

## 2021-08-20 DIAGNOSIS — K219 Gastro-esophageal reflux disease without esophagitis: Secondary | ICD-10-CM | POA: Diagnosis not present

## 2021-08-20 DIAGNOSIS — Z8261 Family history of arthritis: Secondary | ICD-10-CM | POA: Diagnosis not present

## 2021-08-20 DIAGNOSIS — G4700 Insomnia, unspecified: Secondary | ICD-10-CM | POA: Diagnosis not present

## 2021-08-20 DIAGNOSIS — I10 Essential (primary) hypertension: Secondary | ICD-10-CM | POA: Diagnosis not present

## 2021-08-20 DIAGNOSIS — M1990 Unspecified osteoarthritis, unspecified site: Secondary | ICD-10-CM | POA: Diagnosis not present

## 2021-08-20 DIAGNOSIS — Z818 Family history of other mental and behavioral disorders: Secondary | ICD-10-CM | POA: Diagnosis not present

## 2021-08-20 DIAGNOSIS — Z720 Tobacco use: Secondary | ICD-10-CM | POA: Diagnosis not present

## 2021-08-20 DIAGNOSIS — G8929 Other chronic pain: Secondary | ICD-10-CM | POA: Diagnosis not present

## 2021-08-20 DIAGNOSIS — Z791 Long term (current) use of non-steroidal anti-inflammatories (NSAID): Secondary | ICD-10-CM | POA: Diagnosis not present

## 2021-08-20 DIAGNOSIS — Z823 Family history of stroke: Secondary | ICD-10-CM | POA: Diagnosis not present

## 2021-08-20 DIAGNOSIS — R69 Illness, unspecified: Secondary | ICD-10-CM | POA: Diagnosis not present

## 2021-09-03 DIAGNOSIS — E7800 Pure hypercholesterolemia, unspecified: Secondary | ICD-10-CM | POA: Diagnosis not present

## 2021-09-03 DIAGNOSIS — R69 Illness, unspecified: Secondary | ICD-10-CM | POA: Diagnosis not present

## 2021-09-03 DIAGNOSIS — N183 Chronic kidney disease, stage 3 unspecified: Secondary | ICD-10-CM | POA: Diagnosis not present

## 2021-09-03 DIAGNOSIS — I10 Essential (primary) hypertension: Secondary | ICD-10-CM | POA: Diagnosis not present

## 2021-09-03 DIAGNOSIS — Z6821 Body mass index (BMI) 21.0-21.9, adult: Secondary | ICD-10-CM | POA: Diagnosis not present

## 2021-09-03 DIAGNOSIS — M159 Polyosteoarthritis, unspecified: Secondary | ICD-10-CM | POA: Diagnosis not present

## 2021-10-17 DIAGNOSIS — E0590 Thyrotoxicosis, unspecified without thyrotoxic crisis or storm: Secondary | ICD-10-CM | POA: Diagnosis not present

## 2021-10-17 DIAGNOSIS — E46 Unspecified protein-calorie malnutrition: Secondary | ICD-10-CM | POA: Diagnosis not present

## 2021-10-17 DIAGNOSIS — I10 Essential (primary) hypertension: Secondary | ICD-10-CM | POA: Diagnosis not present

## 2021-10-17 DIAGNOSIS — R634 Abnormal weight loss: Secondary | ICD-10-CM | POA: Diagnosis not present

## 2021-10-17 DIAGNOSIS — R519 Headache, unspecified: Secondary | ICD-10-CM | POA: Diagnosis not present

## 2021-10-17 DIAGNOSIS — Z1231 Encounter for screening mammogram for malignant neoplasm of breast: Secondary | ICD-10-CM | POA: Diagnosis not present

## 2021-11-01 ENCOUNTER — Other Ambulatory Visit: Payer: Self-pay | Admitting: Family Medicine

## 2021-11-01 DIAGNOSIS — Z1231 Encounter for screening mammogram for malignant neoplasm of breast: Secondary | ICD-10-CM

## 2021-11-07 DIAGNOSIS — I10 Essential (primary) hypertension: Secondary | ICD-10-CM | POA: Diagnosis not present

## 2021-11-27 ENCOUNTER — Other Ambulatory Visit: Payer: Self-pay | Admitting: Family Medicine

## 2021-11-27 ENCOUNTER — Ambulatory Visit
Admission: RE | Admit: 2021-11-27 | Discharge: 2021-11-27 | Disposition: A | Discharge status: Home / Self Care | Payer: Medicare HMO | Source: Ambulatory Visit | Attending: Family Medicine | Admitting: Family Medicine

## 2021-11-27 DIAGNOSIS — Z1231 Encounter for screening mammogram for malignant neoplasm of breast: Secondary | ICD-10-CM | POA: Diagnosis not present

## 2021-11-30 ENCOUNTER — Other Ambulatory Visit: Payer: Self-pay | Admitting: Family Medicine

## 2021-11-30 DIAGNOSIS — R928 Other abnormal and inconclusive findings on diagnostic imaging of breast: Secondary | ICD-10-CM

## 2021-11-30 DIAGNOSIS — N6489 Other specified disorders of breast: Secondary | ICD-10-CM

## 2021-12-03 DIAGNOSIS — I10 Essential (primary) hypertension: Secondary | ICD-10-CM | POA: Diagnosis not present

## 2021-12-03 DIAGNOSIS — N183 Chronic kidney disease, stage 3 unspecified: Secondary | ICD-10-CM | POA: Diagnosis not present

## 2021-12-03 DIAGNOSIS — M159 Polyosteoarthritis, unspecified: Secondary | ICD-10-CM | POA: Diagnosis not present

## 2021-12-03 DIAGNOSIS — R69 Illness, unspecified: Secondary | ICD-10-CM | POA: Diagnosis not present

## 2021-12-03 DIAGNOSIS — E7800 Pure hypercholesterolemia, unspecified: Secondary | ICD-10-CM | POA: Diagnosis not present

## 2021-12-20 ENCOUNTER — Ambulatory Visit
Admission: RE | Admit: 2021-12-20 | Discharge: 2021-12-20 | Disposition: A | Discharge status: Home / Self Care | Payer: Medicare HMO | Source: Ambulatory Visit | Attending: Family Medicine | Admitting: Family Medicine

## 2021-12-20 DIAGNOSIS — N6489 Other specified disorders of breast: Secondary | ICD-10-CM | POA: Diagnosis not present

## 2021-12-20 DIAGNOSIS — R928 Other abnormal and inconclusive findings on diagnostic imaging of breast: Secondary | ICD-10-CM | POA: Diagnosis not present

## 2022-01-29 DIAGNOSIS — D3132 Benign neoplasm of left choroid: Secondary | ICD-10-CM | POA: Diagnosis not present

## 2022-02-01 DIAGNOSIS — Z961 Presence of intraocular lens: Secondary | ICD-10-CM | POA: Diagnosis not present

## 2022-03-08 DIAGNOSIS — L299 Pruritus, unspecified: Secondary | ICD-10-CM | POA: Diagnosis not present

## 2022-03-08 DIAGNOSIS — Z23 Encounter for immunization: Secondary | ICD-10-CM | POA: Diagnosis not present

## 2022-03-08 DIAGNOSIS — Z1331 Encounter for screening for depression: Secondary | ICD-10-CM | POA: Diagnosis not present

## 2022-03-08 DIAGNOSIS — I10 Essential (primary) hypertension: Secondary | ICD-10-CM | POA: Diagnosis not present

## 2022-03-08 DIAGNOSIS — Z139 Encounter for screening, unspecified: Secondary | ICD-10-CM | POA: Diagnosis not present

## 2022-03-08 DIAGNOSIS — R69 Illness, unspecified: Secondary | ICD-10-CM | POA: Diagnosis not present

## 2022-03-08 DIAGNOSIS — Z9181 History of falling: Secondary | ICD-10-CM | POA: Diagnosis not present

## 2022-03-08 DIAGNOSIS — M159 Polyosteoarthritis, unspecified: Secondary | ICD-10-CM | POA: Diagnosis not present

## 2022-03-08 DIAGNOSIS — E7800 Pure hypercholesterolemia, unspecified: Secondary | ICD-10-CM | POA: Diagnosis not present

## 2022-03-08 DIAGNOSIS — N183 Chronic kidney disease, stage 3 unspecified: Secondary | ICD-10-CM | POA: Diagnosis not present

## 2022-03-24 DIAGNOSIS — E0590 Thyrotoxicosis, unspecified without thyrotoxic crisis or storm: Secondary | ICD-10-CM | POA: Insufficient documentation

## 2022-05-13 DIAGNOSIS — R5383 Other fatigue: Secondary | ICD-10-CM | POA: Diagnosis not present

## 2022-05-13 DIAGNOSIS — Z91018 Allergy to other foods: Secondary | ICD-10-CM | POA: Diagnosis not present

## 2022-05-13 DIAGNOSIS — M2550 Pain in unspecified joint: Secondary | ICD-10-CM | POA: Diagnosis not present

## 2022-05-13 DIAGNOSIS — R634 Abnormal weight loss: Secondary | ICD-10-CM | POA: Diagnosis not present

## 2022-05-13 DIAGNOSIS — E0590 Thyrotoxicosis, unspecified without thyrotoxic crisis or storm: Secondary | ICD-10-CM | POA: Diagnosis not present

## 2022-06-11 DIAGNOSIS — N183 Chronic kidney disease, stage 3 unspecified: Secondary | ICD-10-CM | POA: Diagnosis not present

## 2022-06-11 DIAGNOSIS — M159 Polyosteoarthritis, unspecified: Secondary | ICD-10-CM | POA: Diagnosis not present

## 2022-06-11 DIAGNOSIS — I10 Essential (primary) hypertension: Secondary | ICD-10-CM | POA: Diagnosis not present

## 2022-06-11 DIAGNOSIS — R5383 Other fatigue: Secondary | ICD-10-CM | POA: Diagnosis not present

## 2022-06-11 DIAGNOSIS — R69 Illness, unspecified: Secondary | ICD-10-CM | POA: Diagnosis not present

## 2022-06-11 DIAGNOSIS — Z1331 Encounter for screening for depression: Secondary | ICD-10-CM | POA: Diagnosis not present

## 2022-06-11 DIAGNOSIS — Z91018 Allergy to other foods: Secondary | ICD-10-CM | POA: Diagnosis not present

## 2022-06-11 DIAGNOSIS — Z139 Encounter for screening, unspecified: Secondary | ICD-10-CM | POA: Diagnosis not present

## 2022-08-07 DIAGNOSIS — I10 Essential (primary) hypertension: Secondary | ICD-10-CM | POA: Diagnosis not present

## 2022-08-07 DIAGNOSIS — M159 Polyosteoarthritis, unspecified: Secondary | ICD-10-CM | POA: Diagnosis not present

## 2022-08-07 DIAGNOSIS — N183 Chronic kidney disease, stage 3 unspecified: Secondary | ICD-10-CM | POA: Diagnosis not present

## 2022-08-07 DIAGNOSIS — R69 Illness, unspecified: Secondary | ICD-10-CM | POA: Diagnosis not present

## 2022-08-07 DIAGNOSIS — R002 Palpitations: Secondary | ICD-10-CM | POA: Diagnosis not present

## 2022-08-07 DIAGNOSIS — E0590 Thyrotoxicosis, unspecified without thyrotoxic crisis or storm: Secondary | ICD-10-CM | POA: Diagnosis not present

## 2022-08-08 DIAGNOSIS — R002 Palpitations: Secondary | ICD-10-CM | POA: Diagnosis not present

## 2022-08-08 DIAGNOSIS — E0590 Thyrotoxicosis, unspecified without thyrotoxic crisis or storm: Secondary | ICD-10-CM | POA: Diagnosis not present

## 2022-09-11 DIAGNOSIS — I10 Essential (primary) hypertension: Secondary | ICD-10-CM | POA: Diagnosis not present

## 2022-09-11 DIAGNOSIS — R002 Palpitations: Secondary | ICD-10-CM | POA: Diagnosis not present

## 2022-09-11 DIAGNOSIS — E0590 Thyrotoxicosis, unspecified without thyrotoxic crisis or storm: Secondary | ICD-10-CM | POA: Diagnosis not present

## 2022-10-14 DIAGNOSIS — I10 Essential (primary) hypertension: Secondary | ICD-10-CM | POA: Diagnosis not present

## 2022-10-14 DIAGNOSIS — R002 Palpitations: Secondary | ICD-10-CM | POA: Diagnosis not present

## 2022-10-14 DIAGNOSIS — E0590 Thyrotoxicosis, unspecified without thyrotoxic crisis or storm: Secondary | ICD-10-CM | POA: Diagnosis not present

## 2022-12-18 DIAGNOSIS — G629 Polyneuropathy, unspecified: Secondary | ICD-10-CM | POA: Diagnosis not present

## 2022-12-18 DIAGNOSIS — F418 Other specified anxiety disorders: Secondary | ICD-10-CM | POA: Diagnosis not present

## 2022-12-18 DIAGNOSIS — M159 Polyosteoarthritis, unspecified: Secondary | ICD-10-CM | POA: Diagnosis not present

## 2022-12-18 DIAGNOSIS — N183 Chronic kidney disease, stage 3 unspecified: Secondary | ICD-10-CM | POA: Diagnosis not present

## 2022-12-18 DIAGNOSIS — I10 Essential (primary) hypertension: Secondary | ICD-10-CM | POA: Diagnosis not present

## 2022-12-18 DIAGNOSIS — E0590 Thyrotoxicosis, unspecified without thyrotoxic crisis or storm: Secondary | ICD-10-CM | POA: Diagnosis not present

## 2023-01-09 DIAGNOSIS — Z9181 History of falling: Secondary | ICD-10-CM | POA: Diagnosis not present

## 2023-01-09 DIAGNOSIS — Z0000 Encounter for general adult medical examination without abnormal findings: Secondary | ICD-10-CM | POA: Diagnosis not present

## 2023-01-09 DIAGNOSIS — Z1331 Encounter for screening for depression: Secondary | ICD-10-CM | POA: Diagnosis not present

## 2023-01-10 ENCOUNTER — Other Ambulatory Visit: Payer: Self-pay | Admitting: Physician Assistant

## 2023-01-10 DIAGNOSIS — Z1231 Encounter for screening mammogram for malignant neoplasm of breast: Secondary | ICD-10-CM

## 2023-01-10 DIAGNOSIS — M810 Age-related osteoporosis without current pathological fracture: Secondary | ICD-10-CM

## 2023-01-16 DIAGNOSIS — Z008 Encounter for other general examination: Secondary | ICD-10-CM | POA: Diagnosis not present

## 2023-01-16 DIAGNOSIS — M810 Age-related osteoporosis without current pathological fracture: Secondary | ICD-10-CM | POA: Diagnosis not present

## 2023-01-16 DIAGNOSIS — Z9181 History of falling: Secondary | ICD-10-CM | POA: Diagnosis not present

## 2023-01-16 DIAGNOSIS — R32 Unspecified urinary incontinence: Secondary | ICD-10-CM | POA: Diagnosis not present

## 2023-01-16 DIAGNOSIS — Z791 Long term (current) use of non-steroidal anti-inflammatories (NSAID): Secondary | ICD-10-CM | POA: Diagnosis not present

## 2023-01-16 DIAGNOSIS — N182 Chronic kidney disease, stage 2 (mild): Secondary | ICD-10-CM | POA: Diagnosis not present

## 2023-01-16 DIAGNOSIS — J449 Chronic obstructive pulmonary disease, unspecified: Secondary | ICD-10-CM | POA: Diagnosis not present

## 2023-01-16 DIAGNOSIS — Z8249 Family history of ischemic heart disease and other diseases of the circulatory system: Secondary | ICD-10-CM | POA: Diagnosis not present

## 2023-01-16 DIAGNOSIS — M1990 Unspecified osteoarthritis, unspecified site: Secondary | ICD-10-CM | POA: Diagnosis not present

## 2023-01-16 DIAGNOSIS — F419 Anxiety disorder, unspecified: Secondary | ICD-10-CM | POA: Diagnosis not present

## 2023-01-16 DIAGNOSIS — K219 Gastro-esophageal reflux disease without esophagitis: Secondary | ICD-10-CM | POA: Diagnosis not present

## 2023-01-16 DIAGNOSIS — G4700 Insomnia, unspecified: Secondary | ICD-10-CM | POA: Diagnosis not present

## 2023-01-16 DIAGNOSIS — E0590 Thyrotoxicosis, unspecified without thyrotoxic crisis or storm: Secondary | ICD-10-CM | POA: Diagnosis not present

## 2023-02-20 ENCOUNTER — Telehealth: Payer: Self-pay

## 2023-02-20 NOTE — Telephone Encounter (Signed)
Patient was contacted to schedule repeat colonoscopy.  Last colonoscopy performed by Dr. Mechele Collin 2017.    Patient requested to have an EGD performed because despite taking 40mg  of omeprazole she is still experiencing burning when she swallows food, and food has been getting stuck.  She has been advised due to age and her GI Issues she will require an office visit.  She requested to have this office visit scheduled with Dr. Servando Snare.  This message has been forwarded to front office team to request an office visit.  Thanks, [place], [place]

## 2023-02-20 NOTE — Telephone Encounter (Signed)
Pt has appointment sched for 03/26/2023

## 2023-03-21 DIAGNOSIS — Z91018 Allergy to other foods: Secondary | ICD-10-CM | POA: Diagnosis not present

## 2023-03-21 DIAGNOSIS — M159 Polyosteoarthritis, unspecified: Secondary | ICD-10-CM | POA: Diagnosis not present

## 2023-03-21 DIAGNOSIS — N183 Chronic kidney disease, stage 3 unspecified: Secondary | ICD-10-CM | POA: Diagnosis not present

## 2023-03-21 DIAGNOSIS — I10 Essential (primary) hypertension: Secondary | ICD-10-CM | POA: Diagnosis not present

## 2023-03-21 DIAGNOSIS — F418 Other specified anxiety disorders: Secondary | ICD-10-CM | POA: Diagnosis not present

## 2023-03-21 DIAGNOSIS — E7800 Pure hypercholesterolemia, unspecified: Secondary | ICD-10-CM | POA: Diagnosis not present

## 2023-03-21 DIAGNOSIS — K219 Gastro-esophageal reflux disease without esophagitis: Secondary | ICD-10-CM | POA: Diagnosis not present

## 2023-03-21 DIAGNOSIS — E0590 Thyrotoxicosis, unspecified without thyrotoxic crisis or storm: Secondary | ICD-10-CM | POA: Diagnosis not present

## 2023-03-21 DIAGNOSIS — Z23 Encounter for immunization: Secondary | ICD-10-CM | POA: Diagnosis not present

## 2023-03-21 DIAGNOSIS — Z131 Encounter for screening for diabetes mellitus: Secondary | ICD-10-CM | POA: Diagnosis not present

## 2023-03-26 ENCOUNTER — Encounter: Payer: Self-pay | Admitting: Gastroenterology

## 2023-03-26 ENCOUNTER — Ambulatory Visit: Payer: Medicare HMO | Admitting: Gastroenterology

## 2023-03-26 VITALS — BP 124/78 | HR 71 | Temp 97.7°F | Wt 112.0 lb

## 2023-03-26 DIAGNOSIS — R1319 Other dysphagia: Secondary | ICD-10-CM

## 2023-03-26 NOTE — Patient Instructions (Signed)
The barium swallow has been scheduled for October 30th, at [facility] entrance  You must arrive at 10:00 AM to register.  If you need to cancel or reschedule, please call scheduling at [phone]

## 2023-03-26 NOTE — Progress Notes (Signed)
Gastroenterology Consultation  Referring Provider:     Ailene Ravel, MD Primary Care Physician:  Ailene Ravel, MD Primary Gastroenterologist:  Dr. Servando Snare     Reason for Consultation:     Dysphagia        HPI:   Audrey Acosta is a 77 y.o. y/o female referred for consultation & management of dysphagia by Dr. Nathanial Rancher, Durward Fortes, MD. This patient comes in today with a history of dysphagia.  The patient has had a dilation of her esophagus by Dr. Mechele Collin many years ago.  The patient is a retired endoscopy nurse from our endoscopy unit.  She now reports that she is having intermittent food getting stuck.  She states that she has alpha gal and does not eat meat but has had episodes where she has what feels like an esophageal spasm causing food to not be able to go down.  There is no report of any unexplained weight loss fevers chills nausea vomiting black stools or bloody stools.  The patient does have a history of reflux.  Past Medical History:  Diagnosis Date   Arthritis    Depression    GERD (gastroesophageal reflux disease)    Hypertension    Sigmoid diverticulitis     Past Surgical History:  Procedure Laterality Date   ABDOMINAL HYSTERECTOMY  77   APPENDECTOMY     AUGMENTATION MAMMAPLASTY Bilateral 2000   SALINE - RETROPECTORAL   BUNIONECTOMY  01   rt   COLONOSCOPY WITH PROPOFOL N/A 06/09/2015   Procedure: COLONOSCOPY WITH PROPOFOL;  Surgeon: Scot Jun, MD;  Location: [facility] ENDOSCOPY;  Service: Endoscopy;  Laterality: N/A;   GANGLION CYST EXCISION  90's   rt wrist   REVERSE SHOULDER ARTHROPLASTY  03/05/2012   REVERSE SHOULDER ARTHROPLASTY  03/05/2012   Procedure: REVERSE SHOULDER ARTHROPLASTY;  Surgeon: Senaida Lange, MD;  Location: [facility] OR;  Service: Orthopedics;  Laterality: Right;  right reverse shoulder arthroplasty   SHOULDER ARTHROSCOPY  12   rt, lft rotator 07,08   TONSILLECTOMY  73    Prior to Admission medications   Medication Sig Start Date End Date Taking?  Authorizing Provider  citalopram (CELEXA) 20 MG tablet Take 20 mg by mouth daily.   Yes [provider]  meloxicam (MOBIC) 15 MG tablet Take 15 mg by mouth daily.   Yes [provider]  metoprolol (LOPRESSOR) 50 MG tablet Take 75 mg by mouth 2 (two) times daily.   Yes [provider]  omeprazole (PRILOSEC) 20 MG capsule Take 20 mg by mouth daily.   Yes [provider]  traZODone (DESYREL) 50 MG tablet Take 50 mg by mouth at bedtime.   Yes [provider]  valsartan (DIOVAN) 320 MG tablet Take 320 mg by mouth daily.   Yes [provider]  buPROPion (WELLBUTRIN XL) 150 MG 24 hr tablet Take 150 mg by mouth daily. Patient not taking: Reported on 03/26/2023 03/24/23   [provider]    Family History  Problem Relation Age of Onset   Breast cancer Neg Hx      Social History   Tobacco Use   Smoking status: Every Day    Current packs/day: 0.25    Average packs/day: 0.3 packs/day for 20.0 years (5.0 ttl pk-yrs)    Types: Cigarettes   Smokeless tobacco: Never  Substance Use Topics   Alcohol use: No   Drug use: No    Allergies as of 03/26/2023 - Review Complete 03/26/2023  Allergen Reaction Noted   Macrodantin [nitrofurantoin macrocrystal] Shortness Of Breath 02/21/2012   Percocet [oxycodone-acetaminophen]  02/21/2012    Review of Systems:    All systems reviewed and negative except where noted in HPI.   Physical Exam:  BP 124/78 (BP Location: Left Arm, Patient Position: Sitting, Cuff Size: Normal)   Pulse 71   Temp 97.7 F (36.5 C) (Oral)   Wt 112 lb (50.8 kg)   BMI 20.49 kg/m  No LMP recorded. Patient has had a hysterectomy. General:   Alert,  Well-developed, well-nourished, pleasant and cooperative in NAD Head:  Normocephalic and atraumatic. Eyes:  Sclera clear, no icterus.   Conjunctiva pink. Ears:  Normal auditory acuity. Neurologic:  Alert and oriented x3;  grossly normal neurologically. Skin:  Intact  without significant lesions or rashes.  No jaundice. Psych:  Alert and cooperative. Normal mood and affect.  Imaging Studies: No results found.  Assessment and Plan:   Audrey Acosta is a 77 y.o. y/o female who comes in today with a history of dysphagia and a history of esophageal stricture.  The patient is having symptoms consistent with possible esophageal spasms.  Before we set her up for an upper endoscopy I have suggested that she undergo a barium swallow.  The reason for this is that if it is spasms the patient can avoid any invasive procedures such as a EGD.  The patient will be notified of the results of the barium swallow.  The patient has been explained the plan and agrees with it.    Midge Minium, MD. Clementeen Graham    Note: This dictation was prepared with Dragon dictation along with smaller phrase technology. Any transcriptional errors that result from this process are unintentional.

## 2023-04-02 ENCOUNTER — Other Ambulatory Visit: Payer: Self-pay | Admitting: Gastroenterology

## 2023-04-02 ENCOUNTER — Ambulatory Visit
Admission: RE | Admit: 2023-04-02 | Discharge: 2023-04-02 | Disposition: A | Discharge status: Home / Self Care | Payer: Medicare HMO | Source: Ambulatory Visit | Attending: Gastroenterology | Admitting: Gastroenterology

## 2023-04-02 DIAGNOSIS — R1319 Other dysphagia: Secondary | ICD-10-CM

## 2023-04-02 DIAGNOSIS — R1310 Dysphagia, unspecified: Secondary | ICD-10-CM | POA: Diagnosis not present

## 2023-04-08 ENCOUNTER — Ambulatory Visit
Admission: RE | Admit: 2023-04-08 | Discharge: 2023-04-08 | Disposition: A | Discharge status: Home / Self Care | Payer: Medicare HMO | Source: Ambulatory Visit | Attending: Physician Assistant | Admitting: Physician Assistant

## 2023-04-08 ENCOUNTER — Other Ambulatory Visit: Payer: Self-pay | Admitting: Physician Assistant

## 2023-04-08 DIAGNOSIS — Z1231 Encounter for screening mammogram for malignant neoplasm of breast: Secondary | ICD-10-CM | POA: Diagnosis not present

## 2023-04-08 DIAGNOSIS — M810 Age-related osteoporosis without current pathological fracture: Secondary | ICD-10-CM | POA: Insufficient documentation

## 2023-04-10 ENCOUNTER — Other Ambulatory Visit: Payer: Self-pay | Admitting: Physician Assistant

## 2023-04-10 ENCOUNTER — Other Ambulatory Visit: Payer: Self-pay

## 2023-04-10 DIAGNOSIS — R1319 Other dysphagia: Secondary | ICD-10-CM

## 2023-04-11 ENCOUNTER — Other Ambulatory Visit: Payer: Self-pay | Admitting: Physician Assistant

## 2023-04-11 DIAGNOSIS — R928 Other abnormal and inconclusive findings on diagnostic imaging of breast: Secondary | ICD-10-CM

## 2023-04-16 ENCOUNTER — Inpatient Hospital Stay
Admission: RE | Admit: 2023-04-16 | Discharge: 2023-04-16 | Discharge status: Home / Self Care | Payer: Medicare HMO | Source: Ambulatory Visit | Attending: Physician Assistant | Admitting: Physician Assistant

## 2023-04-16 ENCOUNTER — Ambulatory Visit
Admission: RE | Admit: 2023-04-16 | Discharge: 2023-04-16 | Disposition: A | Discharge status: Home / Self Care | Payer: Medicare HMO | Source: Ambulatory Visit | Attending: Physician Assistant | Admitting: Physician Assistant

## 2023-04-16 DIAGNOSIS — R928 Other abnormal and inconclusive findings on diagnostic imaging of breast: Secondary | ICD-10-CM

## 2023-04-16 DIAGNOSIS — R92323 Mammographic fibroglandular density, bilateral breasts: Secondary | ICD-10-CM | POA: Diagnosis not present

## 2023-04-18 DIAGNOSIS — I10 Essential (primary) hypertension: Secondary | ICD-10-CM | POA: Insufficient documentation

## 2023-04-18 DIAGNOSIS — F32A Depression, unspecified: Secondary | ICD-10-CM | POA: Insufficient documentation

## 2023-04-18 DIAGNOSIS — M1990 Unspecified osteoarthritis, unspecified site: Secondary | ICD-10-CM | POA: Insufficient documentation

## 2023-05-26 DIAGNOSIS — M25512 Pain in left shoulder: Secondary | ICD-10-CM | POA: Diagnosis not present

## 2023-05-26 DIAGNOSIS — M19012 Primary osteoarthritis, left shoulder: Secondary | ICD-10-CM | POA: Diagnosis not present

## 2023-05-26 DIAGNOSIS — Z96611 Presence of right artificial shoulder joint: Secondary | ICD-10-CM | POA: Diagnosis not present

## 2023-06-02 ENCOUNTER — Encounter: Payer: Self-pay | Admitting: Gastroenterology

## 2023-06-03 ENCOUNTER — Encounter
Admission: RE | Disposition: A | Discharge status: Home / Self Care | Payer: Self-pay | Source: Home / Self Care | Attending: Gastroenterology

## 2023-06-03 ENCOUNTER — Ambulatory Visit
Admission: RE | Admit: 2023-06-03 | Discharge: 2023-06-03 | Disposition: A | Discharge status: Home / Self Care | Payer: Medicare HMO | Attending: Gastroenterology | Admitting: Gastroenterology

## 2023-06-03 ENCOUNTER — Ambulatory Visit: Payer: Medicare HMO | Admitting: Anesthesiology

## 2023-06-03 ENCOUNTER — Other Ambulatory Visit: Payer: Self-pay

## 2023-06-03 DIAGNOSIS — F17210 Nicotine dependence, cigarettes, uncomplicated: Secondary | ICD-10-CM | POA: Diagnosis not present

## 2023-06-03 DIAGNOSIS — K219 Gastro-esophageal reflux disease without esophagitis: Secondary | ICD-10-CM | POA: Insufficient documentation

## 2023-06-03 DIAGNOSIS — R1310 Dysphagia, unspecified: Secondary | ICD-10-CM | POA: Diagnosis not present

## 2023-06-03 DIAGNOSIS — K222 Esophageal obstruction: Secondary | ICD-10-CM | POA: Insufficient documentation

## 2023-06-03 DIAGNOSIS — K449 Diaphragmatic hernia without obstruction or gangrene: Secondary | ICD-10-CM | POA: Diagnosis not present

## 2023-06-03 DIAGNOSIS — Z79899 Other long term (current) drug therapy: Secondary | ICD-10-CM | POA: Diagnosis not present

## 2023-06-03 DIAGNOSIS — I10 Essential (primary) hypertension: Secondary | ICD-10-CM | POA: Diagnosis not present

## 2023-06-03 DIAGNOSIS — F32A Depression, unspecified: Secondary | ICD-10-CM | POA: Diagnosis not present

## 2023-06-03 DIAGNOSIS — R1319 Other dysphagia: Secondary | ICD-10-CM

## 2023-06-03 DIAGNOSIS — E0590 Thyrotoxicosis, unspecified without thyrotoxic crisis or storm: Secondary | ICD-10-CM | POA: Insufficient documentation

## 2023-06-03 HISTORY — PX: ESOPHAGOGASTRODUODENOSCOPY (EGD) WITH PROPOFOL: SHX5813

## 2023-06-03 HISTORY — PX: BALLOON DILATION: SHX5330

## 2023-06-03 SURGERY — ESOPHAGOGASTRODUODENOSCOPY (EGD) WITH PROPOFOL
Anesthesia: General

## 2023-06-03 MED ORDER — PROPOFOL 500 MG/50ML IV EMUL
INTRAVENOUS | Status: DC | PRN
  Administered 2023-06-03: 150 ug/kg/min via INTRAVENOUS

## 2023-06-03 MED ORDER — PROPOFOL 10 MG/ML IV BOLUS
INTRAVENOUS | Status: AC
  Filled 2023-06-03: qty 20

## 2023-06-03 MED ORDER — LIDOCAINE 2% (20 MG/ML) 5 ML SYRINGE
INTRAMUSCULAR | Status: DC | PRN
  Administered 2023-06-03: 50 mg via INTRAVENOUS

## 2023-06-03 MED ORDER — SODIUM CHLORIDE 0.9 % IV SOLN
INTRAVENOUS | Status: DC
Start: 2023-06-03 — End: 2023-06-03

## 2023-06-03 MED ORDER — PROPOFOL 1000 MG/100ML IV EMUL
INTRAVENOUS | Status: AC
  Filled 2023-06-03: qty 100

## 2023-06-03 MED ORDER — PROPOFOL 10 MG/ML IV BOLUS
INTRAVENOUS | Status: DC | PRN
  Administered 2023-06-03: 60 mg via INTRAVENOUS

## 2023-06-03 NOTE — Op Note (Signed)
 [facility] Gastroenterology Patient Name: Audrey Acosta Procedure Date: 06/03/2023 8:00 AM MRN: [id number] Account #: [id number] Date of Birth: 04/20/1946 Admit Type: Outpatient Age: 77 Room: [facility] ENDO ROOM 4 Gender: Female Note Status: Finalized Instrument Name: Upper Endoscope [id number] Procedure:             Upper GI endoscopy Indications:           Dysphagia Providers:             Rogelia Copping MD, MD Referring MD:          Charlene CROME. Hamrick (Referring MD) Medicines:             Propofol  per Anesthesia Complications:         No immediate complications. Procedure:             Pre-Anesthesia Assessment:                        - Prior to the procedure, a History and Physical was                         performed, and patient medications and allergies were                         reviewed. The patient's tolerance of previous                         anesthesia was also reviewed. The risks and benefits                         of the procedure and the sedation options and risks                         were discussed with the patient. All questions were                         answered, and informed consent was obtained. Prior                         Anticoagulants: The patient has taken no anticoagulant                         or antiplatelet agents. ASA Grade Assessment: II - A                         patient with mild systemic disease. After reviewing                         the risks and benefits, the patient was deemed in                         satisfactory condition to undergo the procedure.                        After obtaining informed consent, the endoscope was                         passed under direct vision. Throughout the procedure,  the patient's blood pressure, pulse, and oxygen                         saturations were monitored continuously. The                         Endosonoscope was introduced through the mouth, and                          advanced to the esophageal anastomosis. The upper GI                         endoscopy was accomplished without difficulty. The                         patient tolerated the procedure well. Findings:      One benign-appearing, intrinsic mild stenosis was found at the       gastroesophageal junction. The stenosis was traversed. A TTS dilator was       passed through the scope. Dilation with a 15-16.5-18 mm balloon dilator       was performed to 18 mm. The dilation site was examined following       endoscope reinsertion and showed.      A medium-sized hiatal hernia was present.      The stomach was normal.      The examined duodenum was normal. Impression:            - Benign-appearing esophageal stenosis. Dilated.                        - Medium-sized hiatal hernia.                        - Normal stomach.                        - Normal examined duodenum.                        - No specimens collected. Recommendation:        - Discharge patient to home.                        - Resume previous diet.                        - Continue present medications. Procedure Code(s):     --- Professional ---                        [phone], Esophagoscopy, flexible, transoral; with                         transendoscopic balloon dilation (less than 30 mm                         diameter) Diagnosis Code(s):     --- Professional ---                        R13.10, Dysphagia, unspecified  K22.2, Esophageal obstruction CPT copyright 2022 American Medical Association. All rights reserved. The codes documented in this report are preliminary and upon coder review may  be revised to meet current compliance requirements. Rogelia Copping MD, MD 06/03/2023 8:20:14 AM This report has been signed electronically. Number of Addenda: 0 Note Initiated On: 06/03/2023 8:00 AM Estimated Blood Loss:  Estimated blood loss: none.      [facility]

## 2023-06-03 NOTE — H&P (Signed)
 Rogelia Copping, MD [facility] [address] [place], [place] [address] [phone] Fax : [phone]  Primary Care Physician:  Stephanie Charlene CROME, MD Primary Gastroenterologist:  Dr. Copping  Pre-Procedure History & Physical: HPI:  Audrey Acosta is a 77 y.o. female is here for an endoscopy.   Past Medical History:  Diagnosis Date   Arthritis    Depression    GERD (gastroesophageal reflux disease)    Hypertension    Sigmoid diverticulitis     Past Surgical History:  Procedure Laterality Date   ABDOMINAL HYSTERECTOMY  77   APPENDECTOMY     AUGMENTATION MAMMAPLASTY Bilateral 2000   SALINE - RETROPECTORAL   BUNIONECTOMY  01   rt   COLONOSCOPY WITH PROPOFOL  N/A 06/09/2015   Procedure: COLONOSCOPY WITH PROPOFOL ;  Surgeon: Lamar ONEIDA Holmes, MD;  Location: [facility] ENDOSCOPY;  Service: Endoscopy;  Laterality: N/A;   GANGLION CYST EXCISION  90's   rt wrist   REVERSE SHOULDER ARTHROPLASTY  03/05/2012   REVERSE SHOULDER ARTHROPLASTY  03/05/2012   Procedure: REVERSE SHOULDER ARTHROPLASTY;  Surgeon: Franky CHRISTELLA Pointer, MD;  Location: [facility] OR;  Service: Orthopedics;  Laterality: Right;  right reverse shoulder arthroplasty   SHOULDER ARTHROSCOPY  12   rt, lft rotator 07,08   TONSILLECTOMY  73    Prior to Admission medications   Medication Sig Start Date End Date Taking? Authorizing Provider  citalopram  (CELEXA ) 20 MG tablet Take 20 mg by mouth daily.   Yes [provider]  metoprolol  (LOPRESSOR ) 50 MG tablet Take 75 mg by mouth 2 (two) times daily.   Yes [provider]  valsartan (DIOVAN) 320 MG tablet Take 320 mg by mouth daily.   Yes [provider]  buPROPion  (WELLBUTRIN  XL) 150 MG 24 hr tablet Take 150 mg by mouth daily. Patient not taking: Reported on 03/26/2023 03/24/23   [provider]  meloxicam (MOBIC) 15 MG tablet Take 15 mg by mouth daily.    [provider]  omeprazole (PRILOSEC) 20 MG capsule Take 20 mg by mouth daily.    [provider]  traZODone  (DESYREL ) 50 MG tablet Take 50 mg by mouth at bedtime.    [provider]    Allergies as of 04/10/2023 - Review Complete 03/26/2023  Allergen Reaction Noted   Macrodantin [nitrofurantoin macrocrystal] Shortness Of Breath 02/21/2012   Percocet [oxycodone-acetaminophen ]  02/21/2012    Family History  Problem Relation Age of Onset   Breast cancer Neg Hx     Social History   Socioeconomic History   Marital status: Widowed    Spouse name: Not on file   Number of children: Not on file   Years of education: Not on file   Highest education level: Not on file  Occupational History   Not on file  Tobacco Use   Smoking status: Every Day    Current packs/day: 0.25    Average packs/day: 0.3 packs/day for 20.0 years (5.0 ttl pk-yrs)    Types: Cigarettes   Smokeless tobacco: Never  Vaping Use   Vaping status: Never Used  Substance and Sexual Activity   Alcohol use: No   Drug use: No   Sexual activity: Not on file  Other Topics Concern   Not on file  Social History Narrative   Not on file   Social Drivers of Health   Financial Resource Strain: Not on file  Food Insecurity: Not on file  Transportation Needs: Not on file  Physical Activity: Not on file  Stress: Not on file  Social Connections: Not on file  Intimate Partner Violence: Not on file    Review of Systems: See HPI, otherwise negative ROS  Physical Exam: BP (!) 153/68   Pulse 62   Temp (!) 97.3 F (36.3 C) (Temporal)   Resp 16   Ht 5' 1 (1.549 m)   Wt 50.1 kg   SpO2 99%   BMI 20.86 kg/m  General:   Alert,  pleasant and cooperative in NAD Head:  Normocephalic and atraumatic. Neck:  Supple; no masses or thyromegaly. Lungs:  Clear throughout to auscultation.    Heart:  Regular rate and rhythm. Abdomen:  Soft, nontender and nondistended. Normal bowel sounds, without guarding, and without rebound.   Neurologic:  Alert and  oriented x4;  grossly normal  neurologically.  Impression/Plan: Audrey Acosta is here for an endoscopy to be performed for dysphagia  Risks, benefits, limitations, and alternatives regarding  endoscopy have been reviewed with the patient.  Questions have been answered.  All parties agreeable.   Rogelia Copping, MD  06/03/2023, 8:04 AM

## 2023-06-03 NOTE — Transfer of Care (Signed)
 Immediate Anesthesia Transfer of Care Note  Patient: Audrey Acosta  Procedure(s) Performed: ESOPHAGOGASTRODUODENOSCOPY (EGD) WITH PROPOFOL   Patient Location: PACU  Anesthesia Type:General  Level of Consciousness: drowsy  Airway & Oxygen Therapy: Patient Spontanous Breathing  Post-op Assessment: Report given to RN and Post -op Vital signs reviewed and stable  Post vital signs: Reviewed and stable  Last Vitals:  Vitals Value Taken Time  BP    Temp    Pulse    Resp    SpO2      Last Pain:  Vitals:   06/03/23 0750  TempSrc: Temporal  PainSc: 0-No pain         Complications: No notable events documented.

## 2023-06-03 NOTE — Anesthesia Postprocedure Evaluation (Signed)
 Anesthesia Post Note  Patient: Audrey Acosta  Procedure(s) Performed: ESOPHAGOGASTRODUODENOSCOPY (EGD) WITH PROPOFOL  BALLOON DILATION  Patient location during evaluation: Endoscopy Anesthesia Type: General Level of consciousness: awake and alert Pain management: pain level controlled Vital Signs Assessment: post-procedure vital signs reviewed and stable Respiratory status: spontaneous breathing, nonlabored ventilation and respiratory function stable Cardiovascular status: blood pressure returned to baseline and stable Postop Assessment: no apparent nausea or vomiting Anesthetic complications: no   No notable events documented.   Last Vitals:  Vitals:   06/03/23 0822 06/03/23 0832  BP: 120/71 136/77  Pulse: (!) 56 65  Resp: 19 17  Temp:  (!) 36.1 C  SpO2: 96% 98%    Last Pain:  Vitals:   06/03/23 0832  TempSrc: Temporal  PainSc: 0-No pain                 Camellia Merilee Louder

## 2023-06-03 NOTE — Anesthesia Preprocedure Evaluation (Addendum)
 Anesthesia Evaluation  Patient identified by MRN, date of birth, ID band Patient awake    Reviewed: Allergy & Precautions, H&P , NPO status , Patient's Chart, lab work & pertinent test results, reviewed documented beta blocker date and time   Airway Mallampati: II  TM Distance: >3 FB Neck ROM: full    Dental no notable dental hx.    Pulmonary Current SmokerPatient did not abstain from smoking.   Pulmonary exam normal        Cardiovascular hypertension, Normal cardiovascular exam     Neuro/Psych  PSYCHIATRIC DISORDERS  Depression    negative neurological ROS     GI/Hepatic Neg liver ROS,GERD  Medicated,,  Endo/Other   Hyperthyroidism   Renal/GU negative Renal ROS  negative genitourinary   Musculoskeletal   Abdominal Normal abdominal exam  (+)   Peds  Hematology negative hematology ROS (+)   Anesthesia Other Findings Past Medical History: No date: Arthritis No date: Depression No date: GERD (gastroesophageal reflux disease) No date: Hypertension No date: Sigmoid diverticulitis  Past Surgical History: 45: ABDOMINAL HYSTERECTOMY No date: APPENDECTOMY 2000: AUGMENTATION MAMMAPLASTY; Bilateral     Comment:  SALINE - RETROPECTORAL 01: BUNIONECTOMY     Comment:  rt 06/09/2015: COLONOSCOPY WITH PROPOFOL ; N/A     Comment:  Procedure: COLONOSCOPY WITH PROPOFOL ;  Surgeon: Lamar ONEIDA Holmes, MD;  Location: [facility] ENDOSCOPY;  Service:               Endoscopy;  Laterality: N/A; 90's: GANGLION CYST EXCISION     Comment:  rt wrist 03/05/2012: REVERSE SHOULDER ARTHROPLASTY 03/05/2012: REVERSE SHOULDER ARTHROPLASTY     Comment:  Procedure: REVERSE SHOULDER ARTHROPLASTY;  Surgeon:               Franky CHRISTELLA Pointer, MD;  Location: [facility] OR;  Service:               Orthopedics;  Laterality: Right;  right reverse shoulder               arthroplasty 10: SHOULDER ARTHROSCOPY     Comment:  rt, lft rotator 07,08 73:  TONSILLECTOMY     Reproductive/Obstetrics negative OB ROS                             Anesthesia Physical Anesthesia Plan  ASA: 2  Anesthesia Plan: General   Post-op Pain Management:    Induction: Intravenous  PONV Risk Score and Plan: Propofol  infusion and TIVA  Airway Management Planned: Natural Airway  Additional Equipment:   Intra-op Plan:   Post-operative Plan:   Informed Consent: I have reviewed the patients History and Physical, chart, labs and discussed the procedure including the risks, benefits and alternatives for the proposed anesthesia with the patient or authorized representative who has indicated his/her understanding and acceptance.     Dental Advisory Given  Plan Discussed with: CRNA and Surgeon  Anesthesia Plan Comments:         Anesthesia Quick Evaluation

## 2023-06-05 ENCOUNTER — Encounter: Payer: Self-pay | Admitting: Gastroenterology

## 2023-07-24 DIAGNOSIS — E0590 Thyrotoxicosis, unspecified without thyrotoxic crisis or storm: Secondary | ICD-10-CM | POA: Diagnosis not present

## 2023-07-24 DIAGNOSIS — N183 Chronic kidney disease, stage 3 unspecified: Secondary | ICD-10-CM | POA: Diagnosis not present

## 2023-07-24 DIAGNOSIS — E7800 Pure hypercholesterolemia, unspecified: Secondary | ICD-10-CM | POA: Diagnosis not present

## 2023-07-24 DIAGNOSIS — M159 Polyosteoarthritis, unspecified: Secondary | ICD-10-CM | POA: Diagnosis not present

## 2023-07-24 DIAGNOSIS — J441 Chronic obstructive pulmonary disease with (acute) exacerbation: Secondary | ICD-10-CM | POA: Diagnosis not present

## 2023-07-24 DIAGNOSIS — Z131 Encounter for screening for diabetes mellitus: Secondary | ICD-10-CM | POA: Diagnosis not present

## 2023-07-24 DIAGNOSIS — I10 Essential (primary) hypertension: Secondary | ICD-10-CM | POA: Diagnosis not present

## 2023-07-24 DIAGNOSIS — F418 Other specified anxiety disorders: Secondary | ICD-10-CM | POA: Diagnosis not present

## 2023-07-24 DIAGNOSIS — Z91018 Allergy to other foods: Secondary | ICD-10-CM | POA: Diagnosis not present

## 2023-07-24 DIAGNOSIS — K219 Gastro-esophageal reflux disease without esophagitis: Secondary | ICD-10-CM | POA: Diagnosis not present

## 2023-08-29 DIAGNOSIS — E0590 Thyrotoxicosis, unspecified without thyrotoxic crisis or storm: Secondary | ICD-10-CM | POA: Diagnosis not present

## 2023-08-29 DIAGNOSIS — N183 Chronic kidney disease, stage 3 unspecified: Secondary | ICD-10-CM | POA: Diagnosis not present

## 2023-08-29 DIAGNOSIS — J441 Chronic obstructive pulmonary disease with (acute) exacerbation: Secondary | ICD-10-CM | POA: Diagnosis not present

## 2023-08-29 DIAGNOSIS — Z139 Encounter for screening, unspecified: Secondary | ICD-10-CM | POA: Diagnosis not present

## 2023-08-29 DIAGNOSIS — E7800 Pure hypercholesterolemia, unspecified: Secondary | ICD-10-CM | POA: Diagnosis not present

## 2023-08-29 DIAGNOSIS — M19012 Primary osteoarthritis, left shoulder: Secondary | ICD-10-CM | POA: Diagnosis not present

## 2023-08-29 DIAGNOSIS — F418 Other specified anxiety disorders: Secondary | ICD-10-CM | POA: Diagnosis not present

## 2023-08-29 DIAGNOSIS — I10 Essential (primary) hypertension: Secondary | ICD-10-CM | POA: Diagnosis not present

## 2023-08-29 DIAGNOSIS — R000 Tachycardia, unspecified: Secondary | ICD-10-CM | POA: Diagnosis not present

## 2023-08-29 DIAGNOSIS — M159 Polyosteoarthritis, unspecified: Secondary | ICD-10-CM | POA: Diagnosis not present

## 2023-08-29 DIAGNOSIS — K219 Gastro-esophageal reflux disease without esophagitis: Secondary | ICD-10-CM | POA: Diagnosis not present

## 2023-08-29 DIAGNOSIS — Z91018 Allergy to other foods: Secondary | ICD-10-CM | POA: Diagnosis not present

## 2023-09-09 DIAGNOSIS — R000 Tachycardia, unspecified: Secondary | ICD-10-CM | POA: Diagnosis not present

## 2023-09-16 DIAGNOSIS — G459 Transient cerebral ischemic attack, unspecified: Secondary | ICD-10-CM | POA: Diagnosis not present

## 2023-09-16 DIAGNOSIS — R079 Chest pain, unspecified: Secondary | ICD-10-CM | POA: Diagnosis not present

## 2023-10-01 DIAGNOSIS — R002 Palpitations: Secondary | ICD-10-CM | POA: Diagnosis not present

## 2023-10-22 DIAGNOSIS — M159 Polyosteoarthritis, unspecified: Secondary | ICD-10-CM | POA: Diagnosis not present

## 2023-10-22 DIAGNOSIS — K219 Gastro-esophageal reflux disease without esophagitis: Secondary | ICD-10-CM | POA: Diagnosis not present

## 2023-10-22 DIAGNOSIS — M810 Age-related osteoporosis without current pathological fracture: Secondary | ICD-10-CM | POA: Diagnosis not present

## 2023-10-22 DIAGNOSIS — E7800 Pure hypercholesterolemia, unspecified: Secondary | ICD-10-CM | POA: Diagnosis not present

## 2023-10-22 DIAGNOSIS — N183 Chronic kidney disease, stage 3 unspecified: Secondary | ICD-10-CM | POA: Diagnosis not present

## 2023-10-22 DIAGNOSIS — F418 Other specified anxiety disorders: Secondary | ICD-10-CM | POA: Diagnosis not present

## 2023-10-22 DIAGNOSIS — I10 Essential (primary) hypertension: Secondary | ICD-10-CM | POA: Diagnosis not present

## 2023-10-22 DIAGNOSIS — I471 Supraventricular tachycardia, unspecified: Secondary | ICD-10-CM | POA: Diagnosis not present

## 2023-10-22 DIAGNOSIS — E0590 Thyrotoxicosis, unspecified without thyrotoxic crisis or storm: Secondary | ICD-10-CM | POA: Diagnosis not present

## 2023-10-24 DIAGNOSIS — I4891 Unspecified atrial fibrillation: Secondary | ICD-10-CM | POA: Diagnosis not present

## 2023-11-26 DIAGNOSIS — M5459 Other low back pain: Secondary | ICD-10-CM | POA: Diagnosis not present

## 2023-11-26 DIAGNOSIS — M5416 Radiculopathy, lumbar region: Secondary | ICD-10-CM | POA: Diagnosis not present

## 2023-11-26 DIAGNOSIS — M419 Scoliosis, unspecified: Secondary | ICD-10-CM | POA: Diagnosis not present

## 2024-01-28 DIAGNOSIS — I10 Essential (primary) hypertension: Secondary | ICD-10-CM | POA: Diagnosis not present

## 2024-01-28 DIAGNOSIS — N183 Chronic kidney disease, stage 3 unspecified: Secondary | ICD-10-CM | POA: Diagnosis not present

## 2024-01-28 DIAGNOSIS — F418 Other specified anxiety disorders: Secondary | ICD-10-CM | POA: Diagnosis not present

## 2024-01-28 DIAGNOSIS — M810 Age-related osteoporosis without current pathological fracture: Secondary | ICD-10-CM | POA: Diagnosis not present

## 2024-01-28 DIAGNOSIS — M159 Polyosteoarthritis, unspecified: Secondary | ICD-10-CM | POA: Diagnosis not present

## 2024-01-28 DIAGNOSIS — Z1331 Encounter for screening for depression: Secondary | ICD-10-CM | POA: Diagnosis not present

## 2024-01-28 DIAGNOSIS — K219 Gastro-esophageal reflux disease without esophagitis: Secondary | ICD-10-CM | POA: Diagnosis not present

## 2024-01-28 DIAGNOSIS — E0590 Thyrotoxicosis, unspecified without thyrotoxic crisis or storm: Secondary | ICD-10-CM | POA: Diagnosis not present

## 2024-01-28 DIAGNOSIS — I471 Supraventricular tachycardia, unspecified: Secondary | ICD-10-CM | POA: Diagnosis not present

## 2024-01-28 DIAGNOSIS — E7800 Pure hypercholesterolemia, unspecified: Secondary | ICD-10-CM | POA: Diagnosis not present

## 2024-05-04 DIAGNOSIS — R0609 Other forms of dyspnea: Secondary | ICD-10-CM | POA: Diagnosis not present

## 2024-05-04 DIAGNOSIS — I471 Supraventricular tachycardia, unspecified: Secondary | ICD-10-CM | POA: Diagnosis not present

## 2024-05-04 DIAGNOSIS — R0989 Other specified symptoms and signs involving the circulatory and respiratory systems: Secondary | ICD-10-CM | POA: Diagnosis not present

## 2024-05-04 DIAGNOSIS — R42 Dizziness and giddiness: Secondary | ICD-10-CM | POA: Diagnosis not present

## 2024-05-07 DIAGNOSIS — M810 Age-related osteoporosis without current pathological fracture: Secondary | ICD-10-CM | POA: Diagnosis not present

## 2024-05-07 DIAGNOSIS — K219 Gastro-esophageal reflux disease without esophagitis: Secondary | ICD-10-CM | POA: Diagnosis not present

## 2024-05-07 DIAGNOSIS — M159 Polyosteoarthritis, unspecified: Secondary | ICD-10-CM | POA: Diagnosis not present

## 2024-05-07 DIAGNOSIS — I10 Essential (primary) hypertension: Secondary | ICD-10-CM | POA: Diagnosis not present

## 2024-05-07 DIAGNOSIS — E7800 Pure hypercholesterolemia, unspecified: Secondary | ICD-10-CM | POA: Diagnosis not present

## 2024-05-07 DIAGNOSIS — Z23 Encounter for immunization: Secondary | ICD-10-CM | POA: Diagnosis not present

## 2024-05-07 DIAGNOSIS — I471 Supraventricular tachycardia, unspecified: Secondary | ICD-10-CM | POA: Diagnosis not present

## 2024-05-07 DIAGNOSIS — E0590 Thyrotoxicosis, unspecified without thyrotoxic crisis or storm: Secondary | ICD-10-CM | POA: Diagnosis not present

## 2024-05-07 DIAGNOSIS — N183 Chronic kidney disease, stage 3 unspecified: Secondary | ICD-10-CM | POA: Diagnosis not present

## 2024-05-07 DIAGNOSIS — F418 Other specified anxiety disorders: Secondary | ICD-10-CM | POA: Diagnosis not present
